# Patient Record
Sex: Female | Born: 1955 | Race: White | Hispanic: No | Marital: Married | Smoking: Former smoker
Health system: Southern US, Community
[De-identification: ages and names within clinical notes are randomized; demographics above are authoritative.]

## PROBLEM LIST (undated history)

## (undated) DIAGNOSIS — K5090 Crohn's disease, unspecified, without complications: Secondary | ICD-10-CM

## (undated) DIAGNOSIS — R945 Abnormal results of liver function studies: Secondary | ICD-10-CM

## (undated) DIAGNOSIS — H269 Unspecified cataract: Secondary | ICD-10-CM

## (undated) DIAGNOSIS — N6310 Unspecified lump in the right breast, unspecified quadrant: Secondary | ICD-10-CM

## (undated) DIAGNOSIS — M1990 Unspecified osteoarthritis, unspecified site: Secondary | ICD-10-CM

## (undated) DIAGNOSIS — Z460 Encounter for fitting and adjustment of spectacles and contact lenses: Secondary | ICD-10-CM

## (undated) DIAGNOSIS — K5790 Diverticulosis of intestine, part unspecified, without perforation or abscess without bleeding: Secondary | ICD-10-CM

## (undated) DIAGNOSIS — D649 Anemia, unspecified: Secondary | ICD-10-CM

## (undated) DIAGNOSIS — R7989 Other specified abnormal findings of blood chemistry: Secondary | ICD-10-CM

## (undated) HISTORY — DX: Unspecified osteoarthritis, unspecified site: M19.90

## (undated) HISTORY — DX: Unspecified cataract: H26.9

## (undated) HISTORY — PX: BREAST SURGERY: SHX581

## (undated) HISTORY — PX: OTHER SURGICAL HISTORY: SHX169

## (undated) HISTORY — DX: Other specified abnormal findings of blood chemistry: R79.89

## (undated) HISTORY — PX: BREAST EXCISIONAL BIOPSY: SUR124

## (undated) HISTORY — DX: Diverticulosis of intestine, part unspecified, without perforation or abscess without bleeding: K57.90

## (undated) HISTORY — DX: Abnormal results of liver function studies: R94.5

## (undated) HISTORY — DX: Crohn's disease, unspecified, without complications: K50.90

## (undated) HISTORY — DX: Unspecified lump in the right breast, unspecified quadrant: N63.10

---

## 1978-12-23 ENCOUNTER — Encounter (INDEPENDENT_AMBULATORY_CARE_PROVIDER_SITE_OTHER): Payer: Self-pay | Admitting: Gastroenterology

## 1994-06-25 ENCOUNTER — Encounter (INDEPENDENT_AMBULATORY_CARE_PROVIDER_SITE_OTHER): Payer: Self-pay | Admitting: *Deleted

## 1996-09-13 ENCOUNTER — Encounter (INDEPENDENT_AMBULATORY_CARE_PROVIDER_SITE_OTHER): Payer: Self-pay | Admitting: *Deleted

## 1996-10-27 ENCOUNTER — Encounter (INDEPENDENT_AMBULATORY_CARE_PROVIDER_SITE_OTHER): Payer: Self-pay | Admitting: *Deleted

## 1996-11-02 ENCOUNTER — Encounter (INDEPENDENT_AMBULATORY_CARE_PROVIDER_SITE_OTHER): Payer: Self-pay | Admitting: *Deleted

## 1996-12-15 ENCOUNTER — Encounter (INDEPENDENT_AMBULATORY_CARE_PROVIDER_SITE_OTHER): Payer: Self-pay | Admitting: *Deleted

## 1998-07-28 ENCOUNTER — Other Ambulatory Visit: Admission: RE | Admit: 1998-07-28 | Discharge: 1998-07-28 | Payer: Self-pay | Admitting: Obstetrics and Gynecology

## 1999-11-08 ENCOUNTER — Other Ambulatory Visit: Admission: RE | Admit: 1999-11-08 | Discharge: 1999-11-08 | Payer: Self-pay | Admitting: Obstetrics and Gynecology

## 2000-05-23 ENCOUNTER — Encounter (INDEPENDENT_AMBULATORY_CARE_PROVIDER_SITE_OTHER): Payer: Self-pay | Admitting: *Deleted

## 2000-07-08 ENCOUNTER — Encounter (INDEPENDENT_AMBULATORY_CARE_PROVIDER_SITE_OTHER): Payer: Self-pay | Admitting: *Deleted

## 2000-07-08 ENCOUNTER — Other Ambulatory Visit: Admission: RE | Admit: 2000-07-08 | Discharge: 2000-07-08 | Payer: Self-pay | Admitting: Gastroenterology

## 2000-07-08 ENCOUNTER — Encounter (INDEPENDENT_AMBULATORY_CARE_PROVIDER_SITE_OTHER): Payer: Self-pay

## 2001-08-07 ENCOUNTER — Other Ambulatory Visit: Admission: RE | Admit: 2001-08-07 | Discharge: 2001-08-07 | Payer: Self-pay | Admitting: Obstetrics and Gynecology

## 2001-11-25 HISTORY — PX: ABDOMINAL HYSTERECTOMY: SHX81

## 2002-01-22 ENCOUNTER — Encounter (INDEPENDENT_AMBULATORY_CARE_PROVIDER_SITE_OTHER): Payer: Self-pay

## 2002-01-22 ENCOUNTER — Inpatient Hospital Stay (HOSPITAL_COMMUNITY): Admission: RE | Admit: 2002-01-22 | Discharge: 2002-01-24 | Payer: Self-pay

## 2002-01-22 ENCOUNTER — Encounter: Payer: Self-pay | Admitting: Obstetrics and Gynecology

## 2002-03-03 ENCOUNTER — Encounter (INDEPENDENT_AMBULATORY_CARE_PROVIDER_SITE_OTHER): Payer: Self-pay | Admitting: *Deleted

## 2002-03-03 ENCOUNTER — Ambulatory Visit (HOSPITAL_COMMUNITY): Admission: RE | Admit: 2002-03-03 | Discharge: 2002-03-03 | Payer: Self-pay | Admitting: Gastroenterology

## 2002-03-03 ENCOUNTER — Encounter: Payer: Self-pay | Admitting: Gastroenterology

## 2002-09-28 ENCOUNTER — Other Ambulatory Visit: Admission: RE | Admit: 2002-09-28 | Discharge: 2002-09-28 | Payer: Self-pay | Admitting: Obstetrics and Gynecology

## 2002-12-06 ENCOUNTER — Encounter (INDEPENDENT_AMBULATORY_CARE_PROVIDER_SITE_OTHER): Payer: Self-pay | Admitting: *Deleted

## 2002-12-06 ENCOUNTER — Encounter: Admission: RE | Admit: 2002-12-06 | Discharge: 2002-12-06 | Payer: Self-pay | Admitting: Urology

## 2002-12-06 ENCOUNTER — Encounter: Payer: Self-pay | Admitting: Urology

## 2003-10-27 ENCOUNTER — Other Ambulatory Visit: Admission: RE | Admit: 2003-10-27 | Discharge: 2003-10-27 | Payer: Self-pay | Admitting: Obstetrics and Gynecology

## 2004-02-23 ENCOUNTER — Encounter (INDEPENDENT_AMBULATORY_CARE_PROVIDER_SITE_OTHER): Payer: Self-pay | Admitting: Gastroenterology

## 2004-09-03 ENCOUNTER — Encounter: Admission: RE | Admit: 2004-09-03 | Discharge: 2004-09-03 | Payer: Self-pay | Admitting: Obstetrics and Gynecology

## 2004-12-10 ENCOUNTER — Other Ambulatory Visit: Admission: RE | Admit: 2004-12-10 | Discharge: 2004-12-10 | Payer: Self-pay | Admitting: Obstetrics and Gynecology

## 2005-09-11 ENCOUNTER — Ambulatory Visit: Payer: Self-pay | Admitting: Gastroenterology

## 2006-03-19 ENCOUNTER — Other Ambulatory Visit: Admission: RE | Admit: 2006-03-19 | Discharge: 2006-03-19 | Payer: Self-pay | Admitting: Obstetrics and Gynecology

## 2006-12-17 ENCOUNTER — Ambulatory Visit: Payer: Self-pay | Admitting: Gastroenterology

## 2006-12-17 LAB — CONVERTED CEMR LAB
ALT: 26 units/L (ref 0–40)
AST: 24 units/L (ref 0–37)
Albumin: 4.2 g/dL (ref 3.5–5.2)
Alkaline Phosphatase: 60 units/L (ref 39–117)
BUN: 11 mg/dL (ref 6–23)
Basophils Absolute: 0 10*3/uL (ref 0.0–0.1)
Basophils Relative: 0.4 % (ref 0.0–1.0)
CO2: 30 meq/L (ref 19–32)
Calcium: 9.8 mg/dL (ref 8.4–10.5)
Chloride: 103 meq/L (ref 96–112)
Creatinine, Ser: 0.9 mg/dL (ref 0.4–1.2)
Eosinophils Relative: 2.7 % (ref 0.0–5.0)
GFR calc Af Amer: 86 mL/min
GFR calc non Af Amer: 71 mL/min
Glucose, Bld: 101 mg/dL — ABNORMAL HIGH (ref 70–99)
HCT: 38.8 % (ref 36.0–46.0)
Hemoglobin: 13.6 g/dL (ref 12.0–15.0)
Lymphocytes Relative: 24.7 % (ref 12.0–46.0)
MCHC: 35.1 g/dL (ref 30.0–36.0)
MCV: 85 fL (ref 78.0–100.0)
Monocytes Absolute: 0.4 10*3/uL (ref 0.2–0.7)
Monocytes Relative: 7 % (ref 3.0–11.0)
Neutro Abs: 3.8 10*3/uL (ref 1.4–7.7)
Neutrophils Relative %: 65.2 % (ref 43.0–77.0)
Platelets: 348 10*3/uL (ref 150–400)
Potassium: 4.4 meq/L (ref 3.5–5.1)
RBC: 4.57 M/uL (ref 3.87–5.11)
RDW: 12.4 % (ref 11.5–14.6)
Sed Rate: 30 mm/hr — ABNORMAL HIGH (ref 0–25)
Sodium: 139 meq/L (ref 135–145)
Total Bilirubin: 1 mg/dL (ref 0.3–1.2)
Total Protein: 7.7 g/dL (ref 6.0–8.3)
WBC: 5.9 10*3/uL (ref 4.5–10.5)

## 2006-12-23 ENCOUNTER — Ambulatory Visit (HOSPITAL_COMMUNITY): Admission: RE | Admit: 2006-12-23 | Discharge: 2006-12-23 | Payer: Self-pay | Admitting: Gastroenterology

## 2006-12-23 ENCOUNTER — Encounter (INDEPENDENT_AMBULATORY_CARE_PROVIDER_SITE_OTHER): Payer: Self-pay | Admitting: *Deleted

## 2006-12-29 ENCOUNTER — Ambulatory Visit: Payer: Self-pay | Admitting: Gastroenterology

## 2007-04-28 ENCOUNTER — Encounter: Admission: RE | Admit: 2007-04-28 | Discharge: 2007-04-28 | Payer: Self-pay | Admitting: Obstetrics and Gynecology

## 2008-02-10 ENCOUNTER — Ambulatory Visit: Payer: Self-pay | Admitting: Internal Medicine

## 2008-02-10 LAB — CONVERTED CEMR LAB
ALT: 27 units/L (ref 0–35)
AST: 25 units/L (ref 0–37)
Albumin: 4.1 g/dL (ref 3.5–5.2)
BUN: 10 mg/dL (ref 6–23)
Basophils Absolute: 0 10*3/uL (ref 0.0–0.1)
Bilirubin, Direct: 0.1 mg/dL (ref 0.0–0.3)
GFR calc Af Amer: 114 mL/min
Hemoglobin: 13.1 g/dL (ref 12.0–15.0)
Lymphocytes Relative: 24.5 % (ref 12.0–46.0)
MCHC: 33 g/dL (ref 30.0–36.0)
MCV: 87.7 fL (ref 78.0–100.0)
Monocytes Absolute: 0.3 10*3/uL (ref 0.2–0.7)
Monocytes Relative: 5.2 % (ref 3.0–11.0)
Neutro Abs: 3.6 10*3/uL (ref 1.4–7.7)
Potassium: 4.5 meq/L (ref 3.5–5.1)
Total Bilirubin: 1 mg/dL (ref 0.3–1.2)

## 2008-03-11 ENCOUNTER — Ambulatory Visit: Payer: Self-pay | Admitting: Internal Medicine

## 2008-03-11 LAB — CONVERTED CEMR LAB
Basophils Absolute: 0 10*3/uL (ref 0.0–0.1)
Basophils Relative: 0.2 % (ref 0.0–1.0)
Eosinophils Absolute: 0.1 10*3/uL (ref 0.0–0.7)
MCHC: 33.7 g/dL (ref 30.0–36.0)
MCV: 87.2 fL (ref 78.0–100.0)
Neutro Abs: 3.6 10*3/uL (ref 1.4–7.7)
Neutrophils Relative %: 61.6 % (ref 43.0–77.0)
RBC: 4.55 M/uL (ref 3.87–5.11)
RDW: 12.8 % (ref 11.5–14.6)

## 2008-03-14 ENCOUNTER — Ambulatory Visit: Payer: Self-pay | Admitting: Internal Medicine

## 2008-05-17 ENCOUNTER — Telehealth (INDEPENDENT_AMBULATORY_CARE_PROVIDER_SITE_OTHER): Payer: Self-pay | Admitting: *Deleted

## 2008-12-01 ENCOUNTER — Telehealth: Payer: Self-pay | Admitting: Internal Medicine

## 2009-02-22 ENCOUNTER — Ambulatory Visit: Payer: Self-pay | Admitting: Internal Medicine

## 2009-03-08 ENCOUNTER — Ambulatory Visit: Payer: Self-pay | Admitting: Internal Medicine

## 2009-03-08 ENCOUNTER — Encounter: Payer: Self-pay | Admitting: Internal Medicine

## 2009-03-08 DIAGNOSIS — K5090 Crohn's disease, unspecified, without complications: Secondary | ICD-10-CM | POA: Insufficient documentation

## 2009-03-09 LAB — CONVERTED CEMR LAB
ALT: 108 units/L — ABNORMAL HIGH (ref 0–35)
Albumin: 4.2 g/dL (ref 3.5–5.2)
Basophils Absolute: 0 10*3/uL (ref 0.0–0.1)
CO2: 27 meq/L (ref 19–32)
Calcium: 9.2 mg/dL (ref 8.4–10.5)
Chloride: 110 meq/L (ref 96–112)
GFR calc non Af Amer: 80.32 mL/min (ref >60)
Lymphocytes Relative: 25.9 % (ref 12.0–46.0)
Monocytes Relative: 4.4 % (ref 3.0–12.0)
Platelets: 300 10*3/uL (ref 150.0–400.0)
Potassium: 4.2 meq/L (ref 3.5–5.1)
RDW: 13.3 % (ref 11.5–14.6)
Sodium: 144 meq/L (ref 135–145)
Total Protein: 7.5 g/dL (ref 6.0–8.3)
WBC: 5.3 10*3/uL (ref 4.5–10.5)

## 2009-03-10 ENCOUNTER — Encounter: Payer: Self-pay | Admitting: Internal Medicine

## 2009-03-10 ENCOUNTER — Telehealth (INDEPENDENT_AMBULATORY_CARE_PROVIDER_SITE_OTHER): Payer: Self-pay | Admitting: *Deleted

## 2009-03-13 ENCOUNTER — Telehealth (INDEPENDENT_AMBULATORY_CARE_PROVIDER_SITE_OTHER): Payer: Self-pay | Admitting: *Deleted

## 2009-03-20 ENCOUNTER — Ambulatory Visit: Payer: Self-pay | Admitting: Internal Medicine

## 2009-03-20 LAB — CONVERTED CEMR LAB: HCV Ab: NEGATIVE

## 2009-03-22 ENCOUNTER — Encounter: Payer: Self-pay | Admitting: Internal Medicine

## 2009-03-27 ENCOUNTER — Encounter: Payer: Self-pay | Admitting: Internal Medicine

## 2009-04-03 ENCOUNTER — Telehealth (INDEPENDENT_AMBULATORY_CARE_PROVIDER_SITE_OTHER): Payer: Self-pay | Admitting: *Deleted

## 2009-04-10 ENCOUNTER — Ambulatory Visit: Payer: Self-pay | Admitting: Internal Medicine

## 2009-04-11 LAB — CONVERTED CEMR LAB
ALT: 80 units/L — ABNORMAL HIGH (ref 0–35)
Alkaline Phosphatase: 71 units/L (ref 39–117)
Bilirubin, Direct: 0.1 mg/dL (ref 0.0–0.3)
Total Protein: 7.7 g/dL (ref 6.0–8.3)

## 2009-06-02 ENCOUNTER — Ambulatory Visit: Payer: Self-pay | Admitting: Internal Medicine

## 2009-06-05 LAB — CONVERTED CEMR LAB
Basophils Absolute: 0 10*3/uL (ref 0.0–0.1)
Eosinophils Absolute: 0.2 10*3/uL (ref 0.0–0.7)
Hemoglobin: 14 g/dL (ref 12.0–15.0)
Lymphocytes Relative: 22.3 % (ref 12.0–46.0)
MCHC: 34.7 g/dL (ref 30.0–36.0)
Neutro Abs: 4.6 10*3/uL (ref 1.4–7.7)
RDW: 12.4 % (ref 11.5–14.6)

## 2009-06-06 ENCOUNTER — Ambulatory Visit: Payer: Self-pay | Admitting: Internal Medicine

## 2009-06-06 DIAGNOSIS — K5010 Crohn's disease of large intestine without complications: Secondary | ICD-10-CM | POA: Insufficient documentation

## 2009-06-07 DIAGNOSIS — R740 Nonspecific elevation of levels of transaminase and lactic acid dehydrogenase [LDH]: Secondary | ICD-10-CM

## 2009-06-07 DIAGNOSIS — R7401 Elevation of levels of liver transaminase levels: Secondary | ICD-10-CM | POA: Insufficient documentation

## 2009-06-16 LAB — CONVERTED CEMR LAB
Albumin: 3.8 g/dL (ref 3.5–5.2)
Alkaline Phosphatase: 83 units/L (ref 39–117)

## 2009-08-14 ENCOUNTER — Telehealth (INDEPENDENT_AMBULATORY_CARE_PROVIDER_SITE_OTHER): Payer: Self-pay | Admitting: *Deleted

## 2010-03-20 ENCOUNTER — Telehealth (INDEPENDENT_AMBULATORY_CARE_PROVIDER_SITE_OTHER): Payer: Self-pay | Admitting: *Deleted

## 2010-06-18 ENCOUNTER — Telehealth: Payer: Self-pay | Admitting: Internal Medicine

## 2010-12-16 ENCOUNTER — Encounter: Payer: Self-pay | Admitting: Obstetrics and Gynecology

## 2010-12-16 ENCOUNTER — Telehealth: Payer: Self-pay | Admitting: Internal Medicine

## 2010-12-27 NOTE — Progress Notes (Signed)
Summary: Records request from [place] for records received from [facility]. Request forwarded to [facility]. Dena Chavis  March 20, 2010 1:17 PM

## 2010-12-27 NOTE — Progress Notes (Signed)
Summary: ON CALL: ? Crohns flare   Phone Note Call from Patient   Caller: Patient Call For: Dr Henrene Pastor Details for Reason: Crohn"s flare Summary of Call: Has not been seen in 18 months. Does have a history of some megdical noncompliance.Calls while on the road c/o increased BMs and some pain. No fvrs or bleeding. Asked to come to office in am to be see. Said she can't, as she is working on the road as a Administrator. She Asked if she could take some old 6MP tabs. Told not too - previous  concerns of hepatotoxicity. I told her that I do not feel comfortable treating her w/o proper assessment. advised to go to a local ER if she is sick. She will keep her Feb. appt. Initial call taken by: Irene Shipper MD,  December 16, 2010 12:09 PM

## 2010-12-27 NOTE — Progress Notes (Signed)
Summary: note   Phone Note Call from Patient Call back at Home Phone [phone]   Caller: Patient Call For: Dr. Henrene Pastor Reason for Call: Talk to Nurse Summary of Call: needs official note from Dr. Henrene Pastor for her insurance stating that she has been diagnosed with Crohn's Initial call taken by: Lucien Mons,  June 18, 2010 9:14 AM  Follow-up for Phone Call        Pt. given # for [place] she qualifies for [facility] and will come and sign record release to send records to them. Follow-up by: Abel Presto RN,  June 19, 2010 9:16 AM

## 2011-01-03 ENCOUNTER — Ambulatory Visit (INDEPENDENT_AMBULATORY_CARE_PROVIDER_SITE_OTHER): Payer: PRIVATE HEALTH INSURANCE | Admitting: Internal Medicine

## 2011-01-03 ENCOUNTER — Encounter (INDEPENDENT_AMBULATORY_CARE_PROVIDER_SITE_OTHER): Payer: Self-pay | Admitting: *Deleted

## 2011-01-03 ENCOUNTER — Other Ambulatory Visit: Payer: PRIVATE HEALTH INSURANCE

## 2011-01-03 ENCOUNTER — Encounter: Payer: Self-pay | Admitting: Internal Medicine

## 2011-01-03 ENCOUNTER — Other Ambulatory Visit: Payer: Self-pay | Admitting: Internal Medicine

## 2011-01-03 DIAGNOSIS — R197 Diarrhea, unspecified: Secondary | ICD-10-CM

## 2011-01-03 DIAGNOSIS — K5010 Crohn's disease of large intestine without complications: Secondary | ICD-10-CM

## 2011-01-03 LAB — BASIC METABOLIC PANEL
CO2: 26 mEq/L (ref 19–32)
Calcium: 8.8 mg/dL (ref 8.4–10.5)
GFR: 90.06 mL/min (ref >60.00)
Sodium: 137 mEq/L (ref 135–145)

## 2011-01-03 LAB — CBC WITH DIFFERENTIAL/PLATELET
Basophils Absolute: 0 10*3/uL (ref 0.0–0.1)
HCT: 35 % — ABNORMAL LOW (ref 36.0–46.0)
Lymphs Abs: 1.5 10*3/uL (ref 0.7–4.0)
Monocytes Absolute: 0.5 10*3/uL (ref 0.1–1.0)
Monocytes Relative: 5.1 % (ref 3.0–12.0)
Platelets: 391 10*3/uL (ref 150.0–400.0)
RDW: 12.9 % (ref 11.5–14.6)

## 2011-01-03 LAB — HEPATIC FUNCTION PANEL
Alkaline Phosphatase: 56 U/L (ref 39–117)
Bilirubin, Direct: 0.1 mg/dL (ref 0.0–0.3)

## 2011-01-04 ENCOUNTER — Encounter: Payer: Self-pay | Admitting: Internal Medicine

## 2011-01-05 ENCOUNTER — Encounter: Payer: Self-pay | Admitting: Internal Medicine

## 2011-01-10 NOTE — Progress Notes (Signed)
Summary: [name]  [name]   Imported By: Phillis Knack 01/04/2011 09:22:58  _____________________________________________________________________  External Attachment:    Type:   Image     Comment:   External Document

## 2011-01-10 NOTE — Progress Notes (Signed)
Summary: [place]  [place]   Imported By: Phillis Knack 01/04/2011 09:11:43  _____________________________________________________________________  External Attachment:    Type:   Image     Comment:   External Document

## 2011-01-10 NOTE — Procedures (Signed)
Summary: Colonoscopy/[place]  Colonoscopy/[place]   Imported By: Phillis Knack 01/04/2011 09:07:01  _____________________________________________________________________  External Attachment:    Type:   Image     Comment:   External Document

## 2011-01-10 NOTE — Assessment & Plan Note (Signed)
Summary: Crohns flare     History of Present Illness Visit Type: Follow-up Visit Primary GI MD: Scarlette Shorts MD Primary Provider: Jacqulyn Cane, MD Requesting Provider: na Chief Complaint: Pt c/o crohn's flare with diarrhea and LLQ abd pain with burning. Pt denies any other GI complaints History of Present Illness:   55 year old female with a greater than 20 year history of Crohn's colitis. She was last evaluated in July of 2010. She has been lost to followup until she recently contacted the office with symptoms suggesting a flare of disease. In brief summary, she is intolerant to prednisone. She had previously been on 6 MP monotherapy with active colonic inflammation on colonoscopy, some GI symptoms, and suboptimal GTTN levels. A subsequent modest increase in 6-MP resulted in resolution of colonic inflammation and GI symptoms as evidenced on her most recent colonoscopy April 2010. However, on routine blood testing, she was found to have new liver test abnormalities. The viral hepatitis excluded. 6-MP metabolite suggesting possible hepatotoxicity. The drug stopped and liver test abnormalities resolved. She tells me that she has done clinically well until mid December when she began to develop abdominal cramping with urgency and diarrhea as well as left lower quadrant pain. Some nausea though no vomiting. Generalized fatigue with 6-8 pound weight loss. Using Imodium p.r.n. with modest benefit. No bleeding or mucus. No antibiotic exposure. 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   GI Review of Systems    Reports abdominal pain, nausea, and  weight loss.     Location of  Abdominal pain: left side.    Denies acid reflux, belching, bloating, chest pain, dysphagia with liquids, dysphagia with solids, heartburn, loss of appetite, vomiting, vomiting blood, and  weight gain.      Reports diarrhea.     Denies anal fissure, black tarry stools, change  in bowel habit, constipation, diverticulosis, fecal incontinence, heme positive stool, hemorrhoids, irritable bowel syndrome, jaundice, light color stool, liver problems, rectal bleeding, and  rectal pain.    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 Hysterectomy Adenoidectomy  Family History: Reviewed history from 06/06/2009 and no changes required. Family History of Breast Cancer: Pat Aunt No FH of Colon Cancer: Family History of Diabetes: Brother, MGF Family History of Heart Disease: PGF, MGF  Social History: Reviewed history from 06/06/2009 and no changes required. Married, no kids Patient is a former smoker.  Quit 20 years ago Alcohol Use - yes weekend social Illicit Drug Use - no Patient does not get regular exercise.   Review of Systems       The patient complains of fatigue.  The patient denies allergy/sinus, anemia, anxiety-new, arthritis/joint pain, back pain, blood in urine, breast changes/lumps, change in vision, confusion, cough, coughing up blood, depression-new, fainting, fever, headaches-new, hearing problems, heart murmur, heart rhythm changes, itching, menstrual pain, muscle pains/cramps, night sweats, nosebleeds, pregnancy symptoms, shortness of breath, skin rash, sleeping problems, sore throat, swelling of feet/legs, swollen lymph glands, thirst - excessive , urination - excessive , urination changes/pain, urine leakage, vision changes, and voice change.    Vital Signs:  Patient profile:   55 year old female Height:      64 inches Weight:      200.2 pounds BMI:  34.49 BSA:     1.96 Pulse rate:   68 / minute Pulse rhythm:   regular BP sitting:   132 / 76  (left arm) Cuff size:   regular  Vitals  Entered By: Hope Pigeon CMA (January 03, 2011 8:46 AM)  Physical Exam  General:  Well developed, well nourished, no acute distress. Head:  Normocephalic and atraumatic. Eyes:  PERRLA, no icterus. Ears:  Normal auditory acuity. Nose:  No deformity, discharge,  or lesions. Mouth:  No deformity or lesions, dentition normal. Neck:  Supple; no masses or thyromegaly. Lungs:  Clear throughout to auscultation. Heart:  Regular rate and rhythm; no murmurs, rubs,  or bruits. Abdomen:  Soft, nontender and nondistended. No masses, hepatosplenomegaly or hernias noted. Normal bowel sounds. Msk:  Symmetrical with no gross deformities. Normal posture. Pulses:  Normal pulses noted. Extremities:  No clubbing, cyanosis, edema or deformities noted. Neurologic:  Alert and  oriented x4;  grossly normal neurologically. Skin:  Intact without significant lesions or rashes. Psych:  Alert and cooperative. Normal mood and affect.   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 Now experiencing what sounds like a typical flare. She is not toxic and has a benign exam. Intolerant to prednisone and 6-MP as outlined previously. May be a candidate for biological therapy. Medical noncompliance has been an issue previously. We discussed her case together and addressed importance of these issues.  Plan #1. Laboratories including [employer] and C. reactive protein #2. Stool studies including ova and parasites and C. difficile by PCR #3. Empiric course of metronidazole 500 mg b.i.d. x2 weeks #4. Schedule colonoscopy to define the extent and severity of disease. As well, provide surveillance biopsies. The nature of the procedure as well as the risks, benefits, and alternatives have been reviewed. She understood and agreed to proceed. She will set this up at her nearest convenience, based on her schedule.  Other  Orders: T-Stool for O&P (16010-93235) T-Stool Giardia / Crypto- EIA (57322) T-C diff by PCR (02542) Colonoscopy (Colon) TLB-CBC Platelet - w/Differential (85025-CBCD) TLB-BMP (Basic Metabolic Panel-BMET) (70623-JSEGBTD) TLB-Hepatic/Liver Function Pnl (80076-HEPATIC) TLB-TSH (Thyroid Stimulating Hormone) (84443-TSH) TLB-CRP-High Sensitivity (C-Reactive Protein) (86140-FCRP)  Patient Instructions: 1)  Labs ordered for you to go to basement floor and have done today. 2)  Metronidazole 500 mg 1 by mouth two times a day #28 no refills. 3)  Colonoscopy [facility] 01/30/11 10:00 am arrive at 9:00 am on 4th floor.  4)  Copy sent to : Jacqulyn Cane, MD 5)  The medication list was reviewed and reconciled.  All changed / newly prescribed medications were explained.  A complete medication list was provided to the patient / caregiver. Prescriptions: MOVIPREP 100 GM  SOLR (PEG-KCL-NACL-NASULF-NA ASC-C) As per prep instructions.  #1 x 0   Entered by:   Randye Lobo NCMA   Authorized by:   Irene Shipper MD   Signed by:   Randye Lobo NCMA on 01/03/2011   Method used:   Electronically to        [facility] [phone]* (retail)       [address]       [place], [place]  [address]       Ph: [phone]       Fax: [phone]   RxID:   [id number] METRONIDAZOLE 500 MG TABS (METRONIDAZOLE) 1 by mouth two times a day  #28 x 0   Entered by:   Pamala Hurry  Raymondo Band   Authorized by:   Irene Shipper MD   Signed by:   Randye Lobo NCMA on 01/03/2011   Method used:   Electronically to        [facility] [phone]* (retail)       [address]       [place], [place]  [address]       Ph: [phone]       Fax: [phone]   RxID:   [id number]

## 2011-01-10 NOTE — Procedures (Signed)
Summary: Upper GI Endoscopy/[place]  Upper GI Endoscopy/[place]   Imported By: Phillis Knack 01/04/2011 09:07:59  _____________________________________________________________________  External Attachment:    Type:   Image     Comment:   External Document

## 2011-01-10 NOTE — Progress Notes (Signed)
Summary: [facility]  [place]   Imported By: Phillis Knack 01/04/2011 08:57:11  _____________________________________________________________________  External Attachment:    Type:   Image     Comment:   External Document

## 2011-01-10 NOTE — Progress Notes (Signed)
Summary: [place]  [place]   Imported By: Phillis Knack 01/04/2011 09:20:58  _____________________________________________________________________  External Attachment:    Type:   Image     Comment:   External Document

## 2011-01-10 NOTE — Procedures (Signed)
Summary: [facility]   Imported By: Phillis Knack 01/04/2011 09:10:16  _____________________________________________________________________  External Attachment:    Type:   Image     Comment:   External Document

## 2011-01-10 NOTE — Letter (Signed)
Summary: [facility] Medical Instructions  [facility]  [place], [place] [address]   Phone: [phone]  Fax: [phone]       SHATIKA GRINNELL    30-Dec-1955    MRN: [id number]        Procedure Day Sudie Grumbling: Marlana Latus 01/30/11     Arrival Time:9:00 am     Procedure Time:10:00 am     Location of Procedure:                    x  [place] (4th Floor)         [place]   Starting 5 days prior to your procedure 01/25/11 do not eat nuts, seeds, popcorn, corn, beans, peas,  salads, or any raw vegetables.  Do not take any fiber supplements (e.g. Metamucil, Citrucel, and Benefiber).  THE DAY BEFORE YOUR PROCEDURE         DATE:01/29/11  DAY: tuesday  1.  Drink clear liquids the entire day-NO SOLID FOOD  2.  Do not drink anything colored red or purple.  Avoid juices with pulp.  No orange juice.  3.  Drink at least 64 oz. (8 glasses) of fluid/clear liquids during the day to prevent dehydration and help the prep work efficiently.  CLEAR LIQUIDS INCLUDE: Water Jello Ice Popsicles Tea (sugar ok, no milk/cream) Powdered fruit flavored drinks Coffee (sugar ok, no milk/cream) Gatorade Juice: apple, white grape, white cranberry  Lemonade Clear bullion, consomm, broth Carbonated beverages (any kind) Strained chicken noodle soup Hard Candy                             4.  In the morning, mix first dose of MoviPrep solution:    Empty 1 Pouch A and 1 Pouch B into the disposable container    Add lukewarm drinking water to the top line of the container. Mix to dissolve    Refrigerate (mixed solution should be used within 24 hrs)  5.  Begin drinking the prep at 5:00 p.m. The MoviPrep container is divided by 4 marks.   Every 15 minutes drink the solution down to the next mark (approximately 8 oz) until the full liter is complete.   6.  Follow completed prep with 16 oz of clear liquid of your choice (Nothing red or purple).  Continue  to drink clear liquids until bedtime.  7.  Before going to bed, mix second dose of MoviPrep solution:    Empty 1 Pouch A and 1 Pouch B into the disposable container    Add lukewarm drinking water to the top line of the container. Mix to dissolve    Refrigerate  THE DAY OF YOUR PROCEDURE      DATE: 01/30/11 DAY: Marlana Latus  Beginning at 5:00 a.m. (5 hours before procedure):         1. Every 15 minutes, drink the solution down to the next mark (approx 8 oz) until the full liter is complete.  2. Follow completed prep with 16 oz. of clear liquid of your choice.    3. You may drink clear liquids until 8:00 am (2 HOURS BEFORE PROCEDURE).   MEDICATION INSTRUCTIONS  Unless otherwise instructed, you should take regular prescription medications with a small sip of water   as early as possible the morning of your procedure.         OTHER INSTRUCTIONS  You will need a responsible adult  at least 53 years of age to accompany you and drive you home.   This person must remain in the waiting room during your procedure.  Wear loose fitting clothing that is easily removed.  Leave jewelry and other valuables at home.  However, you may wish to bring a book to read or  an iPod/MP3 player to listen to music as you wait for your procedure to start.  Remove all body piercing jewelry and leave at home.  Total time from sign-in until discharge is approximately 2-3 hours.  You should go home directly after your procedure and rest.  You can resume normal activities the  day after your procedure.  The day of your procedure you should not:   Drive   Make legal decisions   Operate machinery   Drink alcohol   Return to work  You will receive specific instructions about eating, activities and medications before you leave.    The above instructions have been reviewed and explained to me by   _______________________    I fully understand and can verbalize these instructions  _____________________________ Date _________

## 2011-01-30 ENCOUNTER — Other Ambulatory Visit: Payer: Self-pay | Admitting: Internal Medicine

## 2011-01-30 ENCOUNTER — Encounter: Payer: Self-pay | Admitting: Internal Medicine

## 2011-01-30 ENCOUNTER — Other Ambulatory Visit (AMBULATORY_SURGERY_CENTER): Payer: PRIVATE HEALTH INSURANCE | Admitting: Internal Medicine

## 2011-01-30 DIAGNOSIS — Z1211 Encounter for screening for malignant neoplasm of colon: Secondary | ICD-10-CM

## 2011-01-30 DIAGNOSIS — K5010 Crohn's disease of large intestine without complications: Secondary | ICD-10-CM

## 2011-01-30 DIAGNOSIS — K5289 Other specified noninfective gastroenteritis and colitis: Secondary | ICD-10-CM

## 2011-02-05 NOTE — Procedures (Addendum)
Summary: Colonoscopy  Patient: [name] Note: All result statuses are Final unless otherwise noted.  Tests: (1) Colonoscopy (COL)   COL Colonoscopy           [facility]     [place], [place]  [address]          COLONOSCOPY PROCEDURE REPORT          PATIENT:  [name] [name], [name] [name]  MR#:  #[id number]     BIRTHDATE:  1956/03/27, 79 yrs. old  GENDER:  female     ENDOSCOPIST:  Docia Chuck. Geri Seminole, MD     REF. BY:  Office Self     PROCEDURE DATE:  01/30/2011     PROCEDURE:  Colonoscopy with biopsies     ASA CLASS:  Class II     INDICATIONS:  Crohn's disease, surveillance and high-risk     screening ; some recent symptoms; 39yrhx Crohns colitis; last     exam 02-2009     MEDICATIONS:   Fentanyl 75 mcg IV, Versed 8 mg IV          DESCRIPTION OF PROCEDURE:   After the risks benefits and     alternatives of the procedure were thoroughly explained, informed     consent was obtained.  Digital rectal exam was performed and     revealed no abnormalities.   The LB CF-[id number]     was introduced through the anus and advanced to the cecum, which     was identified by both the appendix and ileocecal valve, without     limitations.Time to cecum = 2:51 min.  The quality of the prep was     excellent, using MoviPrep.  The instrument was then slowly     withdrawn (time = 6:20 min) as the colon was fully examined.     <<PROCEDUREIMAGES>>          FINDINGS:  The terminal ileum wsa deeply intubated and appeared     normal.  There were mucosal changes consistent with Crohn's     disease. throughout the colon. These were manifested by both     scatterred aphthous ulcers and larger deeper linerar and     serpigenous ulcers. Bx taken.  Retroflexed views in the rectum     revealed no abnormalities.    The scope was then withdrawn from     the patient and the procedure completed.          COMPLICATIONS:  None     ENDOSCOPIC IMPRESSION:     1) Normal  terminal ileum     2) Colitis - Crohn's throughout the colon     RECOMMENDATIONS:     1) Await pathology results     2) Repeat Colonoscopy in 3 years (routine surviellance)     3) Call office next 1-3 days to schedule followup visit in a few     weeks to discuss medical therapies     4) Give patient Educational literature on Biologic agents for     Crohn's          ______________________________     JDocia Chuck PGeri Seminole MD          CC:  DJacqulyn Cane MD; The Patient          n.     eSIGNED:   JDocia Chuck PGeri Seminoleat 01/30/2011 11:16 AM  [name] [name], [name] [name], #[id number]  Note: An exclamation mark (!) indicates a result that was not dispersed into the flowsheet. Document Creation Date: 01/30/2011 11:16 AM _______________________________________________________________________  (1) Order result status: Final Collection or observation date-time: 01/30/2011 11:05 Requested date-time:  Receipt date-time:  Reported date-time:  Referring Physician:   Ordering Physician: Lavena Bullion [phone]) Specimen Source:  Source: Tawanna Cooler Order Number: [id number] Lab site:   Appended Document: Colonoscopy Patient given educational material for Remicade and Humira  Appended Document: Colonoscopy     Procedures Next Due Date:    Colonoscopy: 01/2014

## 2011-02-05 NOTE — Letter (Signed)
Summary: Appt Reminder [facility]  [place], [place] [address]   Phone: [phone]  Fax: [phone]        January 30, 2011 MRN: [id number]    [place], [place]  [address]    Dear Ms. [name] [name],   You have a return appointment with Dr. Henrene Pastor on 02/27/11 at 10am.  Please remember to bring a complete list of the medicines you are taking, your insurance card and your co-pay.  If you have to cancel or reschedule this appointment, please call before 5:00 pm the evening before to avoid a cancellation fee.  If you have any questions or concerns, please call [phone].    Sincerely,    Rosanne Sack RN  Appended Document: Appt Reminder 2 Letter is mailed to the patient's home address

## 2011-02-09 ENCOUNTER — Encounter: Payer: Self-pay | Admitting: Internal Medicine

## 2011-02-12 NOTE — Letter (Addendum)
Summary: Patient Notice- Colon Biospy Results  [facility]  [address] [place], [place] [address]   Phone: [phone]  Fax: [phone]        February 09, 2011 MRN: [id number]    [name] [name] [place], [place]  [address]    Dear [name] [name],  I am pleased to inform you that the biopsies taken during your recent colonoscopy did not show any evidence of cancer or precancerous change upon pathologic examination. As expected, there was active inflammation consistent with Crohn's.  Additional information/recommendations:   __Please keep your return visit to review your condition. Review the provided literature on biologic therapies for Chron's.   __You should have a repeat colonoscopy examination for this problem           in 3 years.    Please call us if you are having persistent problems or have questions about your condition that have not been fully answered at this time.  Sincerely,  Irene Shipper MD   This letter has been electronically signed by your physician.  Appended Document: Patient Notice- Colon Biospy Results letter mailed

## 2011-02-27 ENCOUNTER — Ambulatory Visit: Payer: PRIVATE HEALTH INSURANCE | Admitting: Internal Medicine

## 2011-04-09 NOTE — Assessment & Plan Note (Signed)
[facility]                         GASTROENTEROLOGY OFFICE NOTE   NAME:[name] [name] BALIK                        MRN:          [id number]  DATE:02/10/2008                            DOB:          [birth year]    HISTORY:  This is a 55 year old white female with a history of long-  standing Crohn's colitis, who presents today to re-establish as a new  patient.  The patient has approximately 15-20-year history of Crohn's  disease, involving the colon.  She was initially assessed by Dr. Jim [name] and subsequently has been cared for by Dr. Lyla [name].  The patient  is establishing today, upon Dr. Leanora [name] recent retirement.  She  reports to me initially presenting with joint aches and erythema  nodosum.  As best she can recall and according to the record, her  disease has been limited to the colon.  She has been on a stable dose of  6-mercaptopurine  for years.  Current dosage is 75 mg daily.  She is on  no other medications for her Crohn's disease.  She has not had any blood  work in over a year.  Her last colonoscopy, performed by Dr. Velora [name],  occurred in January of 2008.  At that time, she was found to have active  Crohn's disease, as manifested by linear ulcers and stenosis.  This  involved the transverse and ascending colon.  The ileum was normal.  No  adjustments in medication made.  Patient reports to me that she is  feeling well.  She does have occasional diarrhea or upset stomach, which  she attributes to stress.  No bleeding.  She has had no weight-loss.  She has had anemia in the past.  Her last blood work, in January of  2008, revealed a hemoglobin of 13.6.  Her MCV was normal at 85.  Sedimentation rate elevated at 30.  Basic chemistries essentially  normal.  Albumin and liver tests also normal.   PAST MEDICAL HISTORY:  Crohn's colitis.   PAST SURGICAL HISTORY:  1. Hysterectomy with unilateral oophorectomy.  2. Fistula in ano, status post  fistulotomy, March of 1998.   ALLERGIES:  No known drug allergies.   CURRENT MEDICATIONS:  6-mercaptopurine  75 mg daily, multivitamin and  calcium.   FAMILY HISTORY:  Negative for inflammatory bowel disease or colon  cancer.   SOCIAL HISTORY:  Patient is employed as a Retail buyer.  She is engaged to be married.  No children.  She  does not smoke.  Occasionally uses alcohol.   PHYSICAL EXAM:  Well-appearing female, in no acute distress.  Blood pressure is 110/64, heart rate 64, weight is 214.8 pounds  (increased 7 pounds).  HEENT:  Sclerae anicteric, conjunctivae are pink, oral mucosa is intact,  no adenopathy.  LUNGS:  Clear.  ABDOMEN:  Soft without tenderness, masses or hernia.  Good bowel sounds  heard.  EXTREMITIES:  Without edema.   IMPRESSION:  This is a 55 year old female with a history of long-  standing Crohn's colitis.  Currently on 75 mg of 6-mercaptopurine  today.  By her report, minimal GI symptoms.  I am concerned that she had  active disease on her last colonoscopy.  She was maintained on 75 mg of  6-mercaptopurine.  Assuming that her TPMT phenotype is normal, then this  dose would seem suboptimal.  Optimal dose is about 1.5 mg/kg in normal  metabolizers, which for this patient would be approximately 140 mg of 6-  mercaptopurine  per day.  I discussed with her the long-term  implications of persistent ulceration and inflammation, such as anemia,  stricturing, fistulization, and increased risk of colon cancer.  Given  this, we may need to increase her 6-mercaptopurine  dosage.  I also  discussed with her the importance of more regular blood work while on 6-  mercaptopurine.  With adjustments, blood work every few weeks.  On a  stable dose, at least every three to four months.  She understood all  the above issues as outlined.   RECOMMENDATIONS:  1. Continue current medications for the time-being.  2. Obtain TPMT phenotype, as  well as thiopurine metabolites.  3. Supplemental blood work, including CBC, comprehensive metabolic      panel, erythrocyte sedimentation rate and C-reactive protein.  4. Followup office visit, three to four weeks, to review blood work      and make a decision regarding possible adjustment of her 6-      mercaptopurine.     [name] Chuck. [name] Pastor, MD  Electronically Signed    JNP/MedQ  DD: 02/10/2008  DT: 02/10/2008  Job #: [id number]   cc:   [name] [name], M.D.  [name] [name], M.D.

## 2011-04-09 NOTE — Assessment & Plan Note (Signed)
[facility]                         GASTROENTEROLOGY OFFICE NOTE   NAME:[name] [name], [name] [name]                        MRN:          [id number]  DATE:03/14/2008                            DOB:          Nov 06, 1956    HISTORY:  [name] [name] presents today for followup.  She is a 55 year old with  longstanding Crohn's colitis, who was evaluated February 10, 2008 to  establish as a new patient with me.  See that dictation for details.  At  that time, the principal concern was active disease on her previous  colonoscopy.  Minimal clinical complaints.  It was also noted that her 6-  mercaptopurine dosage may have been suboptimal based on body weight.  As  such, laboratories were obtained including CBC, comprehensive metabolic  panel, sedimentation rate, and C-reactive protein.  CBC, sedimentation  rate, C-reactive protein, and liver function tests were all normal.  Her  basic metabolic panel was normal except for a minimally elevated glucose  at 107.  She has had no interval complaints or problems.  We also  obtained her TPMT enzyme activity which was in the normal range.  Finally, thiopurine metabolites were obtained.  Her 6-TGN metabolite  levels were low at 169.  Her 6-MMPN levels were somewhat elevated at  6738.  She presents now for followup.   PHYSICAL EXAMINATION:  Limited physical exam.  The patient is alert and  oriented, in no acute distress.  Blood pressure is 130/78, heart rate is  76, weight is 216.4 pounds.  ABDOMEN:  Not re-examined.   IMPRESSION:  1. Longstanding Crohn's colitis.  Significant active inflammation most      recent colonoscopy in January of 2008.  Normal TPMT enzyme activity      with subtherapeutic 6-TGN metabolites.  Mild elevation of 6-MMPN      metabolites with normal liver tests.  2. Mild elevation of glucose on basic metabolic panel.  Clinical      significant uncertain.   RECOMMENDATIONS:  We discussed today the possible risks, as well  as  benefits, of increasing her 6-mercaptopurine dosage.  Given her enzymes  levels, as discussed above, I would like to increase her dosage from 75  mg daily to 100 mg daily.  Will have her CBC checked very two weeks for  the time being.  I would also like to check hemoglobin A1c at the time  of her next blood draw.  If this is elevated then she should see Dr.  Burnett Harry.  Finally, I would plan on routine GI followup in about 6 months  and repeat routine colonoscopy in January of 2010 unless otherwise  clinically indicated.  I did discuss with her possible complications of  therapy including pancreatitis, hepatotoxicity, leukopenia, and their  sequelae.  She understood and had all questions answered to her  satisfaction.     Docia Chuck. Henrene Pastor, MD  Electronically Signed   JNP/MedQ  DD: 03/14/2008  DT: 03/14/2008  Job #: [id number]   cc:   Monia Sabal. Corinna Capra, M.D.  Zella Richer Burnett Harry, M.D.

## 2011-04-12 NOTE — Op Note (Signed)
[facility]  Patient:    [name] [name], [name] [name] Visit Number: [id number] MRN: [id number]          Service Type: GYN Location: [place] 01 Attending Physician:  Jerald Kief Dictated by:   Jerald Kief, M.D. Proc. Date: 01/22/02 Admit Date:  01/22/2002                             Operative Report  PREOPERATIVE DIAGNOSES:       Abnormal uterine bleeding, endometrial mass, pelvic pressure, known fibroids, bilateral ovarian cysts.  POSTOPERATIVE DIAGNOSES:      Abnormal uterine bleeding, endometrial mass, pelvic pressure, known fibroids, bilateral ovarian cysts, severe pelvic endometriosis.  PROCEDURE:                    Hysteroscopy, dilatation and curettage, laparoscopy with conversion to laparotomy and subsequent total abdominal hysterectomy, left salpingo-oophorectomy, and right ovarian cystectomy.  SURGEON:                      Jerald Kief, M.D.  ASSISTANT:                    Darlyn Chamber, M.D.  ANESTHESIA:                   General endotracheal.  INDICATIONS:                  Ms. [name] [name] is a 56 year old nulligravida white female with pelvic discomfort with known ovarian cysts bilateral.  She has been on ovulation suppression for this and recent ultrasound shows that she continues to have left 6 cm cyst and right complex cyst measuring 2-3 cm in size.  She has also had abnormal uterine bleeding not responsive to the oral contraceptive agents.  Sonohysterogram showed a small mass in the endometrium. She also has had known fibroids measuring 10 weeks size which cause occasional pelvic pain.  She presents for definitive surgical evaluation and treatment, planned hysteroscopy and D&C, laparoscopy with ovarian preservation if possible, and ovarian cystectomy bilaterally.  Risks and benefits were discussed at length which include, but not limited to, risk if infection, bleeding, damage to bowel, bladder, uterus, tubes, ovaries,  possibility of not being able to preserve the ovaries or uterus, risks associated with blood transfusion, infection.  The patient does give her informed consent.  FINDINGS:                     Small thickening in the anterior endometrium on hysteroscopy and D&C.  No obvious endometrial polyps.  Normal appearing ostia. On laparoscopy she has a large 10-12 week sized fibroid uterus which is densely adhesed to the bowel as well as bilateral ovaries are densely adhered to the posterior cul-de-sac with large endometriomas bilaterally.  Normal appearing appendix.  DESCRIPTION OF PROCEDURE:     After adequate analgesia the patient was placed in the dorsal lithotomy position.  She is sterilely prepped and draped. Bladder was sterilely drained.  Graves speculum was placed.  Tenaculum was placed on the anterior lip of the cervix.  The uterus was sounded to 8 cm. Easily dilated to a #25 Hegar dilator.  The hysteroscope was inserted.  Small thickening on the anterior surface of the uterus was seen.  No obvious polypoid lesion was identified.  Curettage was performed removing this endometrial thickening.  After curettage was performed  reexamination with the hysteroscope revealed normal appearing endometrium with no pathology and normal appearing ostia.  Cohen tenaculum was then placed on the cervix.  A 1 cm infraumbilical skin incision was made.  A Verres needle was inserted.  The abdomen was insufflated to dullness to percussion.  An 11 mm trocar was inserted and the above findings were noted.  Also at the time of entrance of the trocar, the top portion of the trocar did touch the fibroid which was projecting from the fundus to cause a small amount of bleeding which was difficult to control with bipolar cautery, however, was not ______ to be hemorrhagic.  Because of the ______ pelvis, large bilateral endometriomas, nature of the pelvic adhesions, and large uterine size as well as some bleeding from  the fundal fibroid, felt that hysterectomy and ovarian preservation at least on the left side would not be able to be accomplished and definitely could not be accomplished laparoscopically.  At this time the abdomen was desufflated.  I left the operating room and spoke with the patients mother, described what was going on, and she agreed that we need to proceed with a hysterectomy.  At that time I returned to the OR, rescrubbed, and proceeded with a hysterectomy with the assistance of Dr. Arvella Nigh. Laparotomy was performed two fingerbreadths above the pubic symphysis and a Pfannenstiel skin incision was taken down sharply.  The fascia was incised transversely, extended superiorly and inferiorly off the bellies of the rectus muscles, separated sharply in the midline.  Peritoneum was then entered sharply.  OConnor-O-Sullivan retractor was placed.  The uterus was elevated into the incision.  Kelly clamps placed across the utero-ovarian pedicles. Again, a large approximately 8 cm fibroid off the fundus had a small amount of bleeding from the trocar insertion site.  However, there were multiple fibroids making this a globular uterus, very difficult to identify the tubes and round ligament.  Similarly, the posterior cul-de-sac was frozen with the ovaries bilaterally stuck posteriorly.  They were bluntly dissected with finger dissection and also sharp dissection with Metzenbaum scissors. Bilateral endometriomas were noted and rupture did occur during the dissection.  Copious amount of irrigation was applied.  At this time the round ligaments were identified bilaterally, ligated with the LigaSure instrument with good seal noted.  Hysterectomy was carried out using the LigaSure instrument across the utero-ovarian ligaments bilaterally.  The bladder was dissected off the anterior surface of the cervix and the bilateral uterine vasculature were ligated with LigaSure.  At this time the fibroids and  uterine fundus were sharply removed.  The cervix was grasped with a tenaculum.  The bladder was dissected off the anterior surface of the cervix and at this point  the cardinal ligaments were clamped and ligated and the uterosacral ligaments were then clamped and ligated.  The vagina was entered and the remaining portion of the cervix was removed and sent off as uterine specimen.  Vagina was then closed with 0 Monocryl in a figure-of-eight fashion with good approximation, good hemostasis.  The left ovary was then grasped with Babcock clamps, dissected free from the bowel, posterior cul-de-sac, and pelvic side wall.  The ureter was identified. After identifying the ureter the infundibulopelvic ligament was then ligated and the entire left ovary was removed.  Hemostasis was achieved with Bovie cautery.  The right ovary was approximately 3-4 cm in size and had several small endometriomas within.  Ovarian cystectomy was performed because of the patients desire for ovarian preservation.  The endometrioma shell was then dissected out.  Cauterization was carried out with Bovie cautery.  At this time no residual endometriosis was identified in the right ovary and the ovary was closed with 4-0 PDS in a running fashion and the ovary was suspected to the round ligament with 4-0 PDS.  Copious amount of irrigation was applied. Hemostasis was achieved with Bovie cautery on omentum and pelvic side walls from the massive induration from the endometriosis.  Reexamination of the ureters revealed no obvious damage to the ureters.  The right ovary did appear to be normal at this point.  The appendix was also identified as normal.  At this time the peritoneum was then closed with 2-0 Vicryl.  The irrigation was once again applied and this time the fascia was closed with a 0 PDS in a running fashion.  Irrigation was once again applied and after adequate hemostasis the skin was stapled and Steri-Strips applied.   The infraumbilical skin incision was closed with a 0 Vicryl ______ needle in the fascia, 3-0 Vicryl Rapide subcuticular stitch with good approximation, good hemostasis. X-ray was performed since the count was not performed before the total abdominal hysterectomy and no retained sponges were noted.  The patient was stable upon transfer to the recovery room.  The sponge and instrument count was normal x3.  Patient received 2 g of Cefotetan preoperatively.  Estimated blood loss from the procedure was 400 cc. Dictated by:   Jerald Kief, M.D. Attending Physician:  Jerald Kief DD:  01/22/02 TD:  01/22/02 Job: [id number]

## 2011-04-12 NOTE — H&P (Signed)
[facility]  Patient:    [name] [name], [name] [name] Visit Number: [id number] MRN: [id number]          Service Type: Attending:  Jerald Kief, M.D. Dictated by:   Jerald Kief, M.D. Adm. Date:  01/22/02                           History and Physical  HISTORY OF PRESENT ILLNESS:  Ms. Ishmael Holter is a 55 year old nulligravida white female with history of abnormal uterine bleeding, pelvic pain and bilateral ovarian cyst.  She has been on ovulation suppression for the last three to four months; has tolerated it well.  She has had some improvement with back pain, also has had a history of Crohns disease which has been under control for the last several months.  On ultrasound mid-January she continues to have a 6 cm cyst, which measures 6.4 cm x 6.1 x 5.8, with irregular borders and protrusions into the cyst wall.  This is on the left side.  The right ovary has a complex-appearing cyst with irregular thick walls.  The uterus does show several fibroids, continues to be enlarged.  Sonohistogram shows a small calcified mass which measures 4.7 mm in size.  The patient presents for definitive surgical evaluation, treatment of the ovarian cyst and the endometrial mass with abnormal uterine bleeding.  PAST MEDICAL HISTORY:  Crohns disease, followed by Jim Desanctis, M.D.  PAST SURGICAL HISTORY:  Adenoids removed when she was [age].  PAST GYNECOLOGICAL HISTORY:  Known fibroids for a number of years and abnormal bleeding.  Has been on and off birth control pills and IUD in the past.  MEDICATIONS:  Purinethol.  ALLERGIES:  No known drug allergies.  PHYSICAL EXAMINATION:  VITAL SIGNS:  Blood pressure 112/72.  HEART:  Regular rate and rhythm.  LUNGS:  Clear to auscultation bilaterally.  ABDOMEN:  Nondistended and nontender.  PELVIC:  Uterus is anteverted, mobile; approximately 8-10 week size.  Mildly tender to deep palpation on left adnexa, with some mild guarding.   No rebound.   IMPRESSION AND PLAN:  Bilateral ovarian cyst -- left side 6 cm, irregular border.  Normal CA125.  Not responsive to ovarian suppression with oral contraceptive agents.  Right ovary also has several small cysts as well. Recommend laparoscopic evaluation with left ovarian and probable right ovarian cystectomy; possible removal of the tubes and ovaries.  The patient adamantly desires preservation of both ovaries if at all possible.  She also has endometrial mass and small calcification of the fundus, with abnormal uterine bleeding.  This may represent polyp or a fibroid.  She does have known fibroids.  Recommended to have hysteroscopy D&C for evaluation and treatment of this.  The risks and benefits of the above procedures were discussed at length with the patient, which includes but not limited to: risks of infection, bleeding, damage to uterus, tubes, ovaries, bowel or bladder; recurrence of endometrial mass or abnormal bleeding, possibility that this is cancer that appropriate staging needs to be performed, old recurrence of ovarian cyst, also risks associated with the surgery which included infection, bleeding, damage to uterus, tubes, ovaries, bowel or bladder; risks of surgical blood transfusion.  She does give her informed consent. Dictated by:   Jerald Kief, M.D. Attending:  Jerald Kief, M.D. DD:  01/21/02 TD:  01/21/02 Job: [id number]

## 2011-04-12 NOTE — Discharge Summary (Signed)
[facility]  Patient:    [name] [name], [name] [name] Visit Number: [id number] MRN: [id number]          Service Type: GYN Location: [place] 01 Attending Physician:  Jerald Kief Dictated by:   Jerald Kief, M.D. Admit Date:  01/22/2002 Discharge Date: 01/24/2002                             Discharge Summary  HISTORY OF PRESENT ILLNESS:   [name] [name] is a 55 year old nulligravida white female with history of abnormal uterine bleeding, pelvic pain, bilateral ovarian cysts on ovulation suppression for the last three to four months. She continued to have some back pain and she also has a history of Crohns disease. Ultrasound in mid January shows the persistence of a 6-cm cyst measuring 6.4 cm in size, irregular borders, protrusions at the cyst wall, borderline elevated CA-125. Right ovary also shows a complex appearing cyst. She also has known 10-week size fibroids. Also, sonohysterogram showing a calcified mass within the uterus. She presented for definitive surgical evaluation and treatment.  HOSPITAL COURSE:              The patient underwent a planned hysteroscopy/D&C and laparoscopy. The hysteroscopy/D&C was without complications except she did have a mild thickening of the anterior endometrial wall which was removed with a curettage. Upon laparoscopy, she had a frozen pelvis due to large left-sided endometrioma and right-sided endometrioma with multiple fibroids which were encased with endometriosis and also scarred with omentum and bowel. Upon trying to remove some of the adhesions, due to the vascular nature of the fibroids, the patient began having bleeding from fibroids during the evaluation of the pelvis. Because of this, laparoscopic approach was abandoned. I spoke with the patients mother who agreed that laparotomy with hysterectomy and left salpingo-oophorectomy was warranted. We would try to preserve the right ovary. I returned to the  operating room and during a laparotomy through a Pfannenstiel skin incision, lysis of adhesions was performed. Total abdominal hysterectomy with left salpingo-oophorectomy and right ovarian cystectomy was carried out, which was uncomplicated; however, difficult dissections because of the extensive nature of the endometriosis. The estimated blood loss during the procedure was 400 cc. The remaining portion of the right ovary after right ovarian cystectomy appeared to be normal and have good blood flow. Normal appearing appendix.  The patients postoperative course was unremarkable and actually she recovered very well. She remained on Cefotetan for 24 hours, remained afebrile, had good urine output, and by postoperative day #2 was tolerating a regular diet, ambulating without difficulty, had normal active bowel sounds, and was discharged home with followup in the office in two to three days for staple removal. Her postoperative hemoglobin on postoperative day #1 was 9.1.  DISPOSITION:                  The patient is to be discharged home.  DISCHARGE FOLLOWUP:           The patient is to follow up in the office in the next several days for staple removal and in two weeks for incision check.  DISCHARGE MEDICATIONS:        The patient is sent home with prescription for Tylox, #30, and Motrin 800 mg, #30. Dictated by:   Jerald Kief, M.D. Attending Physician:  Jerald Kief DD:  01/24/02 TD:  01/25/02 Job: [id number]

## 2011-04-12 NOTE — Assessment & Plan Note (Signed)
[facility]                         GASTROENTEROLOGY OFFICE NOTE   NAME:[name], [name] LACOUR                        MRN:          [id number]  DATE:12/17/2006                            DOB:          June 30, 1956    [name] comes in on January 23rd.  She has known Crohn's disease.  She is  due for a colonoscopy examination.  She is taking 6-MP 75 mg a day,  along with multivitamins and calcium.  Otherwise she feels fine.   PHYSICAL EXAMINATION:  She weighs 207. Blood pressure 120/72, pulse 84  and regular.  The neck, heart, extremities are unremarkable.   IMPRESSION:  Crohn's disease as described, doing well on 6-MP.   RECOMMENDATIONS:  Get routine blood studies and schedule her for full  colonoscopy examination and followup.     Clarene Reamer, MD  Electronically Signed    SML/MedQ  DD: 12/17/2006  DT: 12/17/2006  Job #: [id number]

## 2011-12-23 DIAGNOSIS — Z0289 Encounter for other administrative examinations: Secondary | ICD-10-CM

## 2012-01-07 ENCOUNTER — Encounter: Payer: Self-pay | Admitting: Internal Medicine

## 2013-08-02 ENCOUNTER — Other Ambulatory Visit: Payer: Self-pay | Admitting: Obstetrics and Gynecology

## 2013-08-02 DIAGNOSIS — N6002 Solitary cyst of left breast: Secondary | ICD-10-CM

## 2013-08-04 ENCOUNTER — Other Ambulatory Visit: Payer: Self-pay | Admitting: Obstetrics and Gynecology

## 2013-08-04 ENCOUNTER — Ambulatory Visit
Admission: RE | Admit: 2013-08-04 | Discharge: 2013-08-04 | Disposition: A | Discharge status: Home / Self Care | Payer: BC Managed Care – PPO | Source: Ambulatory Visit | Attending: Obstetrics and Gynecology | Admitting: Obstetrics and Gynecology

## 2013-08-04 DIAGNOSIS — N6002 Solitary cyst of left breast: Secondary | ICD-10-CM

## 2013-08-16 ENCOUNTER — Ambulatory Visit
Admission: RE | Admit: 2013-08-16 | Discharge: 2013-08-16 | Disposition: A | Discharge status: Home / Self Care | Payer: BC Managed Care – PPO | Source: Ambulatory Visit | Attending: Obstetrics and Gynecology | Admitting: Obstetrics and Gynecology

## 2013-08-16 DIAGNOSIS — N6002 Solitary cyst of left breast: Secondary | ICD-10-CM

## 2013-08-17 ENCOUNTER — Other Ambulatory Visit: Payer: Self-pay | Admitting: Obstetrics and Gynecology

## 2013-08-17 DIAGNOSIS — R921 Mammographic calcification found on diagnostic imaging of breast: Secondary | ICD-10-CM

## 2013-08-19 ENCOUNTER — Ambulatory Visit
Admission: RE | Admit: 2013-08-19 | Discharge: 2013-08-19 | Disposition: A | Discharge status: Home / Self Care | Payer: BC Managed Care – PPO | Source: Ambulatory Visit | Attending: Obstetrics and Gynecology | Admitting: Obstetrics and Gynecology

## 2013-08-19 DIAGNOSIS — R921 Mammographic calcification found on diagnostic imaging of breast: Secondary | ICD-10-CM

## 2013-08-23 ENCOUNTER — Ambulatory Visit (INDEPENDENT_AMBULATORY_CARE_PROVIDER_SITE_OTHER): Payer: Self-pay | Admitting: General Surgery

## 2013-08-24 ENCOUNTER — Encounter (INDEPENDENT_AMBULATORY_CARE_PROVIDER_SITE_OTHER): Payer: Self-pay | Admitting: Surgery

## 2013-08-24 ENCOUNTER — Telehealth (INDEPENDENT_AMBULATORY_CARE_PROVIDER_SITE_OTHER): Payer: Self-pay | Admitting: Surgery

## 2013-08-24 ENCOUNTER — Ambulatory Visit (INDEPENDENT_AMBULATORY_CARE_PROVIDER_SITE_OTHER): Payer: BC Managed Care – PPO | Admitting: Surgery

## 2013-08-24 VITALS — BP 118/64 | HR 70 | Resp 16 | Ht 63.0 in | Wt 191.8 lb

## 2013-08-24 DIAGNOSIS — N6310 Unspecified lump in the right breast, unspecified quadrant: Secondary | ICD-10-CM

## 2013-08-24 DIAGNOSIS — N63 Unspecified lump in unspecified breast: Secondary | ICD-10-CM

## 2013-08-24 HISTORY — DX: Unspecified lump in the right breast, unspecified quadrant: N63.10

## 2013-08-24 NOTE — Progress Notes (Signed)
Patient ID: [name] [name], female   DOB: [birth year], 57 y.o.   MRN: [id number]  Chief Complaint  Patient presents with  . Breast Problem    eval lesion on rt br    HPI [name] [name] is a 57 y.o. female.  She had a routine mammogram done in some abnormalities were found in the right breast, basically calcifications. Needle core biopsies were done both. One is fibrocystic changes with sclerosing adenosis, the other shows a complex sclerosing lesion. There is no evidence of a cancer. She's not had any breast symptoms such as nipple discharge pain or skin changes etc. She's never had prior breast problems. Her only family history is a paternal aunt who had breast cancer late in life.  HPI  Past Medical History  Diagnosis Date  . Crohn disease     Past Surgical History  Procedure Laterality Date  . Abdominal hysterectomy    . Adnoids removed      Family History  Problem Relation Age of Onset  . Cancer Father     lung  . Cancer Paternal Aunt     breast    Social History History  Substance Use Topics  . Smoking status: Former Smoker    Types: Cigarettes    Quit date: 08/24/1988  . Smokeless tobacco: Never Used  . Alcohol Use: Yes    No Known Allergies  Current Outpatient Prescriptions  Medication Sig Dispense Refill  . calcium-vitamin D 250-100 MG-UNIT per tablet Take 1 tablet by mouth 2 (two) times daily.      . Multiple Vitamin (MULTIVITAMIN) tablet Take 1 tablet by mouth daily.      . niacin 100 MG tablet Take 100 mg by mouth daily with breakfast.      . vitamin B-12 (CYANOCOBALAMIN) 100 MCG tablet Take 50 mcg by mouth daily.       No current facility-administered medications for this visit.    Review of Systems Review of Systems  Constitutional: Negative for fever, chills and unexpected weight change.  HENT: Negative for hearing loss, congestion, sore throat, trouble swallowing and voice change.   Eyes: Negative for visual disturbance.  Respiratory: Negative for  cough and wheezing.   Cardiovascular: Negative for chest pain, palpitations and leg swelling.  Gastrointestinal: Negative for nausea, vomiting, abdominal pain, diarrhea, constipation, blood in stool, abdominal distention and anal bleeding.  Genitourinary: Negative for hematuria, vaginal bleeding and difficulty urinating.  Musculoskeletal: Negative for arthralgias.  Skin: Negative for rash and wound.  Neurological: Negative for seizures, syncope and headaches.  Hematological: Negative for adenopathy. Does not bruise/bleed easily.  Psychiatric/Behavioral: Negative for confusion.    Blood pressure 118/64, pulse 70, resp. rate 16, height 5' 3"  (1.6 m), weight 191 lb 12.8 oz (87 kg).  Physical Exam Physical Exam  Vitals reviewed. Constitutional: She is oriented to person, place, and time. She appears well-developed and well-nourished. No distress.  HENT:  Head: Normocephalic and atraumatic.  Mouth/Throat: Oropharynx is clear and moist.  Eyes: Conjunctivae and EOM are normal. Pupils are equal, round, and reactive to light. No scleral icterus.  Neck: Normal range of motion. Neck supple. No tracheal deviation present. No thyromegaly present.  Cardiovascular: Normal rate, regular rhythm, normal heart sounds and intact distal pulses.  Exam reveals no gallop and no friction rub.   No murmur heard. Pulmonary/Chest: Effort normal and breath sounds normal. No respiratory distress. She has no wheezes. She has no rales. Right breast exhibits no inverted nipple, no mass, no nipple discharge,  no skin change and no tenderness. Left breast exhibits no inverted nipple, no mass, no nipple discharge, no skin change and no tenderness. Breasts are symmetrical.    Abdominal: Soft. Bowel sounds are normal. She exhibits no distension and no mass. There is no tenderness. There is no rebound and no guarding.  Musculoskeletal: Normal range of motion. She exhibits no edema and no tenderness.  Lymphadenopathy:    She  has no cervical adenopathy.    She has no axillary adenopathy.       Right: No supraclavicular adenopathy present.       Left: No supraclavicular adenopathy present.  Neurological: She is alert and oriented to person, place, and time.  Skin: Skin is warm and dry. No rash noted. She is not diaphoretic. No erythema.  Psychiatric: She has a normal mood and affect. Her behavior is normal. Judgment and thought content normal.    Data Reviewed I have reviewed the mammogram reports and pathology reports. One of the areas is benign fibrocystic and the other shows a complex sclerosing lesion  Assessment    Fibrocystic changes with an area of a complex sclerosing lesion 12:00 position right breast     Plan    I've recommended wire localized excisional biopsy.  I have discussed the indications for the lumpectomy and described the procedure. She understand that the chance of removal of the abnormal area is very good, but that occasionally we are unable to locate it and may have to do a second procedure. We also discussed the possibility of a second procedure to get additional tissue. Risks of surgery such as bleeding and infection have also been explained, as well as the implications of not doing the surgery. She understands and wishes to proceed.         Deborha Moseley J 08/24/2013, 4:24 PM

## 2013-08-24 NOTE — Patient Instructions (Signed)
We will schedule outpatient surgery to remove the abnormal area in the right breast

## 2013-08-24 NOTE — Telephone Encounter (Signed)
Went over pt financial responsibilities and she will call back to schedule. Placed in pending folder

## 2013-11-05 ENCOUNTER — Encounter (HOSPITAL_BASED_OUTPATIENT_CLINIC_OR_DEPARTMENT_OTHER): Payer: Self-pay | Admitting: *Deleted

## 2013-11-08 ENCOUNTER — Encounter (HOSPITAL_BASED_OUTPATIENT_CLINIC_OR_DEPARTMENT_OTHER): Payer: Self-pay | Admitting: *Deleted

## 2013-11-08 NOTE — Progress Notes (Signed)
No labs needed

## 2013-11-10 ENCOUNTER — Encounter (HOSPITAL_BASED_OUTPATIENT_CLINIC_OR_DEPARTMENT_OTHER)
Admission: RE | Disposition: A | Discharge status: Home / Self Care | Payer: Self-pay | Source: Ambulatory Visit | Attending: Surgery

## 2013-11-10 ENCOUNTER — Ambulatory Visit (HOSPITAL_BASED_OUTPATIENT_CLINIC_OR_DEPARTMENT_OTHER): Payer: BC Managed Care – PPO | Admitting: *Deleted

## 2013-11-10 ENCOUNTER — Ambulatory Visit
Admission: RE | Admit: 2013-11-10 | Discharge: 2013-11-10 | Disposition: A | Discharge status: Home / Self Care | Payer: BC Managed Care – PPO | Source: Ambulatory Visit | Attending: Surgery | Admitting: Surgery

## 2013-11-10 ENCOUNTER — Encounter (HOSPITAL_BASED_OUTPATIENT_CLINIC_OR_DEPARTMENT_OTHER): Payer: BC Managed Care – PPO | Admitting: *Deleted

## 2013-11-10 ENCOUNTER — Ambulatory Visit (HOSPITAL_BASED_OUTPATIENT_CLINIC_OR_DEPARTMENT_OTHER)
Admission: RE | Admit: 2013-11-10 | Discharge: 2013-11-10 | Disposition: A | Discharge status: Home / Self Care | Payer: BC Managed Care – PPO | Source: Ambulatory Visit | Attending: Surgery | Admitting: Surgery

## 2013-11-10 ENCOUNTER — Encounter (HOSPITAL_BASED_OUTPATIENT_CLINIC_OR_DEPARTMENT_OTHER): Payer: Self-pay | Admitting: *Deleted

## 2013-11-10 DIAGNOSIS — N6019 Diffuse cystic mastopathy of unspecified breast: Secondary | ICD-10-CM | POA: Insufficient documentation

## 2013-11-10 DIAGNOSIS — Z803 Family history of malignant neoplasm of breast: Secondary | ICD-10-CM | POA: Insufficient documentation

## 2013-11-10 DIAGNOSIS — R920 Mammographic microcalcification found on diagnostic imaging of breast: Secondary | ICD-10-CM

## 2013-11-10 DIAGNOSIS — N6029 Fibroadenosis of unspecified breast: Secondary | ICD-10-CM | POA: Insufficient documentation

## 2013-11-10 DIAGNOSIS — N6310 Unspecified lump in the right breast, unspecified quadrant: Secondary | ICD-10-CM

## 2013-11-10 HISTORY — DX: Encounter for fitting and adjustment of spectacles and contact lenses: Z46.0

## 2013-11-10 HISTORY — PX: BREAST LUMPECTOMY WITH NEEDLE LOCALIZATION: SHX5759

## 2013-11-10 LAB — POCT HEMOGLOBIN-HEMACUE: Hemoglobin: 12.5 g/dL (ref 12.0–15.0)

## 2013-11-10 SURGERY — BREAST LUMPECTOMY WITH NEEDLE LOCALIZATION
Anesthesia: General | Laterality: Right

## 2013-11-10 MED ORDER — CHLORHEXIDINE GLUCONATE 4 % EX LIQD: 1.0000 "application " | Freq: Once | CUTANEOUS | Status: DC

## 2013-11-10 MED ORDER — FENTANYL CITRATE 0.05 MG/ML IJ SOLN: 50.0000 ug | INTRAMUSCULAR | Status: DC | PRN

## 2013-11-10 MED ORDER — LIDOCAINE HCL (PF) 1 % IJ SOLN
INTRAMUSCULAR | Status: AC
  Filled 2013-11-10: qty 30

## 2013-11-10 MED ORDER — BUPIVACAINE HCL (PF) 0.25 % IJ SOLN
INTRAMUSCULAR | Status: DC | PRN
  Administered 2013-11-10: 20 mL

## 2013-11-10 MED ORDER — DEXAMETHASONE SODIUM PHOSPHATE 4 MG/ML IJ SOLN
INTRAMUSCULAR | Status: DC | PRN
  Administered 2013-11-10: 10 mg via INTRAVENOUS

## 2013-11-10 MED ORDER — CEFAZOLIN SODIUM-DEXTROSE 2-3 GM-% IV SOLR
2.0000 g | INTRAVENOUS | Status: AC
  Administered 2013-11-10: 2 g via INTRAVENOUS

## 2013-11-10 MED ORDER — MIDAZOLAM HCL 5 MG/5ML IJ SOLN
INTRAMUSCULAR | Status: DC | PRN
  Administered 2013-11-10: 2 mg via INTRAVENOUS

## 2013-11-10 MED ORDER — OXYCODONE HCL 5 MG PO TABS
ORAL_TABLET | ORAL | Status: AC
  Filled 2013-11-10: qty 1

## 2013-11-10 MED ORDER — CEFAZOLIN SODIUM-DEXTROSE 2-3 GM-% IV SOLR
INTRAVENOUS | Status: AC
  Filled 2013-11-10: qty 50

## 2013-11-10 MED ORDER — OXYCODONE HCL 5 MG/5ML PO SOLN
5.0000 mg | Freq: Once | ORAL | Status: AC | PRN
Start: 2013-11-10 — End: 2013-11-10

## 2013-11-10 MED ORDER — OXYCODONE HCL 5 MG PO TABS
5.0000 mg | ORAL_TABLET | Freq: Once | ORAL | Status: AC | PRN
Start: 2013-11-10 — End: 2013-11-10
  Administered 2013-11-10: 5 mg via ORAL

## 2013-11-10 MED ORDER — HYDROCODONE-ACETAMINOPHEN 5-325 MG PO TABS: 1.0000 | ORAL_TABLET | ORAL | Status: DC | PRN

## 2013-11-10 MED ORDER — ONDANSETRON HCL 4 MG/2ML IJ SOLN
INTRAMUSCULAR | Status: DC | PRN
  Administered 2013-11-10: 4 mg via INTRAVENOUS

## 2013-11-10 MED ORDER — FENTANYL CITRATE 0.05 MG/ML IJ SOLN
INTRAMUSCULAR | Status: DC | PRN
  Administered 2013-11-10: 100 ug via INTRAVENOUS
  Administered 2013-11-10: 50 ug via INTRAVENOUS

## 2013-11-10 MED ORDER — HYDROMORPHONE HCL PF 1 MG/ML IJ SOLN: 0.2500 mg | INTRAMUSCULAR | Status: DC | PRN

## 2013-11-10 MED ORDER — MIDAZOLAM HCL 2 MG/2ML IJ SOLN
INTRAMUSCULAR | Status: AC
  Filled 2013-11-10: qty 2

## 2013-11-10 MED ORDER — PROPOFOL 10 MG/ML IV BOLUS
INTRAVENOUS | Status: DC | PRN
  Administered 2013-11-10: 200 mg via INTRAVENOUS

## 2013-11-10 MED ORDER — BUPIVACAINE-EPINEPHRINE PF 0.5-1:200000 % IJ SOLN
INTRAMUSCULAR | Status: AC
  Filled 2013-11-10: qty 30

## 2013-11-10 MED ORDER — FENTANYL CITRATE 0.05 MG/ML IJ SOLN
INTRAMUSCULAR | Status: AC
  Filled 2013-11-10: qty 4

## 2013-11-10 MED ORDER — PROMETHAZINE HCL 25 MG/ML IJ SOLN: 6.2500 mg | INTRAMUSCULAR | Status: DC | PRN

## 2013-11-10 MED ORDER — BUPIVACAINE HCL (PF) 0.25 % IJ SOLN
INTRAMUSCULAR | Status: AC
  Filled 2013-11-10: qty 30

## 2013-11-10 MED ORDER — MIDAZOLAM HCL 2 MG/2ML IJ SOLN: 1.0000 mg | INTRAMUSCULAR | Status: DC | PRN

## 2013-11-10 MED ORDER — LIDOCAINE HCL (CARDIAC) 20 MG/ML IV SOLN
INTRAVENOUS | Status: DC | PRN
  Administered 2013-11-10: 100 mg via INTRAVENOUS

## 2013-11-10 MED ORDER — LACTATED RINGERS IV SOLN
INTRAVENOUS | Status: DC
  Administered 2013-11-10: 09:00:00 via INTRAVENOUS

## 2013-11-10 SURGICAL SUPPLY — 49 items
APPLICATOR COTTON TIP 6IN STRL (MISCELLANEOUS) IMPLANT
BINDER BREAST LRG (GAUZE/BANDAGES/DRESSINGS) ×2 IMPLANT
BINDER BREAST MEDIUM (GAUZE/BANDAGES/DRESSINGS) IMPLANT
BINDER BREAST XLRG (GAUZE/BANDAGES/DRESSINGS) IMPLANT
BINDER BREAST XXLRG (GAUZE/BANDAGES/DRESSINGS) IMPLANT
BLADE HEX COATED 2.75 (ELECTRODE) ×2 IMPLANT
BLADE SURG 15 STRL LF DISP TIS (BLADE) ×1 IMPLANT
BLADE SURG 15 STRL SS (BLADE) ×1
CANISTER SUCT 1200ML W/VALVE (MISCELLANEOUS) ×2 IMPLANT
CHLORAPREP W/TINT 26ML (MISCELLANEOUS) ×2 IMPLANT
CLIP TI MEDIUM 6 (CLIP) IMPLANT
CLIP TI WIDE RED SMALL 6 (CLIP) IMPLANT
COVER MAYO STAND STRL (DRAPES) ×2 IMPLANT
COVER TABLE BACK 60X90 (DRAPES) ×2 IMPLANT
DECANTER SPIKE VIAL GLASS SM (MISCELLANEOUS) ×2 IMPLANT
DERMABOND ADVANCED (GAUZE/BANDAGES/DRESSINGS) ×1
DERMABOND ADVANCED .7 DNX12 (GAUZE/BANDAGES/DRESSINGS) ×1 IMPLANT
DEVICE DUBIN W/COMP PLATE 8390 (MISCELLANEOUS) ×2 IMPLANT
DRAPE LAPAROTOMY TRNSV 102X78 (DRAPE) ×2 IMPLANT
DRAPE SURG 17X23 STRL (DRAPES) IMPLANT
DRAPE UTILITY XL STRL (DRAPES) ×2 IMPLANT
ELECT REM PT RETURN 9FT ADLT (ELECTROSURGICAL) ×2
ELECTRODE REM PT RTRN 9FT ADLT (ELECTROSURGICAL) ×1 IMPLANT
GLOVE BIOGEL M STRL SZ7.5 (GLOVE) ×2 IMPLANT
GLOVE BIOGEL PI IND STRL 7.0 (GLOVE) ×1 IMPLANT
GLOVE BIOGEL PI IND STRL 8 (GLOVE) ×1 IMPLANT
GLOVE BIOGEL PI INDICATOR 7.0 (GLOVE) ×1
GLOVE BIOGEL PI INDICATOR 8 (GLOVE) ×1
GLOVE EUDERMIC 7 POWDERFREE (GLOVE) ×2 IMPLANT
GOWN PREVENTION PLUS XLARGE (GOWN DISPOSABLE) ×2 IMPLANT
GOWN PREVENTION PLUS XXLARGE (GOWN DISPOSABLE) ×2 IMPLANT
KIT MARKER MARGIN INK (KITS) ×2 IMPLANT
NEEDLE HYPO 25X1 1.5 SAFETY (NEEDLE) ×2 IMPLANT
NS IRRIG 1000ML POUR BTL (IV SOLUTION) IMPLANT
PACK BASIN DAY SURGERY FS (CUSTOM PROCEDURE TRAY) ×2 IMPLANT
PENCIL BUTTON HOLSTER BLD 10FT (ELECTRODE) ×2 IMPLANT
SHEET MEDIUM DRAPE 40X70 STRL (DRAPES) ×2 IMPLANT
SLEEVE SCD COMPRESS KNEE MED (MISCELLANEOUS) ×2 IMPLANT
SPONGE GAUZE 4X4 12PLY (GAUZE/BANDAGES/DRESSINGS) IMPLANT
SPONGE INTESTINAL PEANUT (DISPOSABLE) IMPLANT
SPONGE LAP 4X18 X RAY DECT (DISPOSABLE) ×2 IMPLANT
STAPLER VISISTAT 35W (STAPLE) IMPLANT
SUT MNCRL AB 4-0 PS2 18 (SUTURE) ×2 IMPLANT
SUT SILK 0 TIES 10X30 (SUTURE) IMPLANT
SUT VICRYL 3-0 CR8 SH (SUTURE) ×2 IMPLANT
SYR CONTROL 10ML LL (SYRINGE) ×2 IMPLANT
TOWEL OR NON WOVEN STRL DISP B (DISPOSABLE) IMPLANT
TUBE CONNECTING 20X1/4 (TUBING) IMPLANT
YANKAUER SUCT BULB TIP NO VENT (SUCTIONS) IMPLANT

## 2013-11-10 NOTE — OR Nursing (Signed)
Pin placed at [facility] on breast grid.

## 2013-11-10 NOTE — Anesthesia Postprocedure Evaluation (Signed)
Anesthesia Post Note  Patient: [name] [name]  Procedure(s) Performed: Procedure(s) (LRB): RIGHT BREAST MASS NEEDLE LOCALIZATION (Right)  Anesthesia type: general  Patient location: PACU  Post pain: Pain level controlled  Post assessment: Patient's Cardiovascular Status Stable  Last Vitals:  Filed Vitals:   11/10/13 1131  BP: 129/75  Pulse: 62  Temp: 36.8 C  Resp: 16    Post vital signs: Reviewed and stable  Level of consciousness: sedated  Complications: No apparent anesthesia complications

## 2013-11-10 NOTE — H&P (Signed)
NAME: [name] [name]       DOB: 09-11-1956           DATE: 08/30/2013       QMG:[id number]  HPI: [name] [name] is a 57 y.o. female. She had a routine mammogram done in some abnormalities were found in the right breast, basically calcifications. Needle core biopsies were done both. One is fibrocystic changes with sclerosing adenosis, the other shows a complex sclerosing lesion. There is no evidence of a cancer. She's not had any breast symptoms such as nipple discharge pain or skin changes etc. She's never had prior breast problems. Her only family history is a paternal aunt who had breast cancer late in life.   She presents today for wire localized excision of these areas   EXAM: Vital signs: BP 121/74  Pulse 71  Temp(Src) 98.1 F (36.7 C) (Oral)  Resp 20  Ht 5' 3"  (1.6 m)  Wt 190 lb (86.183 kg)  BMI 33.67 kg/m2  SpO2 96%  Constitutional: She is oriented to person, place, and time. She appears well-developed and well-nourished. No distress.  HENT:  Head: Normocephalic and atraumatic.  Mouth/Throat: Oropharynx is clear and moist.  Eyes: Conjunctivae and EOM are normal. Pupils are equal, round, and reactive to light. No scleral icterus.  Neck: Normal range of motion. Neck supple. No tracheal deviation present. No thyromegaly present.  Cardiovascular: Normal rate, regular rhythm, normal heart sounds and intact distal pulses. Exam reveals no gallop and no friction rub.  No murmur heard.  Pulmonary/Chest: Effort normal and breath sounds normal. No respiratory distress. She has no wheezes. She has no rales. Right breast exhibits no inverted nipple, no mass, no nipple discharge, no skin change and no tenderness. Left breast exhibits no inverted nipple, no mass, no nipple discharge, no skin change and no tenderness. Breasts are symmetrical. Breast - guide wire inplace on right  Abdominal: Soft. Bowel sounds are normal. She exhibits no distension and no mass. There is no tenderness. There is no  rebound and no guarding.  Musculoskeletal: Normal range of motion. She exhibits no edema and no tenderness.  Lymphadenopathy:  She has no cervical adenopathy.  She has no axillary adenopathy.  Right: No supraclavicular adenopathy present.  Left: No supraclavicular adenopathy present.  Neurological: She is alert and oriented to person, place, and time.  Skin: Skin is warm and dry. No rash noted. She is not diaphoretic. No erythema.  Psychiatric: She has a normal mood and affect. Her behavior is normal. Judgment and thought content normal.   IMP: Two adjacent areas of breast abnormality  PLAN: wire localized excision. I have reviewed plans again today with the patient and her husband, reviewed the wire loc films and marked the right breast as the operative side. All questions answered  [name] 11/10/2013

## 2013-11-10 NOTE — Anesthesia Procedure Notes (Signed)
Procedure Name: LMA Insertion Date/Time: 11/10/2013 9:34 AM Performed by: [name] Pre-anesthesia Checklist: Patient identified, Emergency Drugs available, Suction available and Patient being monitored Patient Re-evaluated:Patient Re-evaluated prior to inductionOxygen Delivery Method: Circle System Utilized Preoxygenation: Pre-oxygenation with 100% oxygen Intubation Type: IV induction Ventilation: Mask ventilation without difficulty LMA: LMA inserted LMA Size: 4.0 Number of attempts: 1 Airway Equipment and Method: bite block Placement Confirmation: positive ETCO2 Tube secured with: Tape Dental Injury: Teeth and Oropharynx as per pre-operative assessment

## 2013-11-10 NOTE — Anesthesia Preprocedure Evaluation (Addendum)
Anesthesia Evaluation  Patient identified by MRN, date of birth, ID band Patient awake    Reviewed: Allergy & Precautions, H&P , NPO status , Patient's Chart, lab work & pertinent test results  History of Anesthesia Complications Negative for: history of anesthetic complications  Airway Mallampati: II TM Distance: >3 FB Neck ROM: Full    Dental  (+) Teeth Intact and Dental Advisory Given   Pulmonary neg pulmonary ROS, former smoker,    Pulmonary exam normal       Cardiovascular negative cardio ROS      Neuro/Psych negative neurological ROS  negative psych ROS   GI/Hepatic negative GI ROS, Neg liver ROS,   Endo/Other  negative endocrine ROS  Renal/GU negative Renal ROS     Musculoskeletal   Abdominal   Peds  Hematology   Anesthesia Other Findings   Reproductive/Obstetrics negative OB ROS                          Anesthesia Physical Anesthesia Plan  ASA: II  Anesthesia Plan: General LMA   Post-op Pain Management:    Induction:   Airway Management Planned:   Additional Equipment:   Intra-op Plan:   Post-operative Plan: Extubation in OR  Informed Consent: I have reviewed the patients History and Physical, chart, labs and discussed the procedure including the risks, benefits and alternatives for the proposed anesthesia with the patient or authorized representative who has indicated his/her understanding and acceptance.   Dental advisory given  Plan Discussed with: Anesthesiologist, CRNA and Surgeon  Anesthesia Plan Comments:        Anesthesia Quick Evaluation

## 2013-11-10 NOTE — Transfer of Care (Signed)
Immediate Anesthesia Transfer of Care Note  Patient: [name] [name]  Procedure(s) Performed: Procedure(s): RIGHT BREAST MASS NEEDLE LOCALIZATION (Right)  Patient Location: PACU  Anesthesia Type:General  Level of Consciousness: awake, alert  and oriented  Airway & Oxygen Therapy: Patient Spontanous Breathing and Patient connected to face mask oxygen  Post-op Assessment: Report given to PACU RN, Post -op Vital signs reviewed and stable and Patient moving all extremities  Post vital signs: Reviewed and stable  Complications: No apparent anesthesia complications

## 2013-11-10 NOTE — Op Note (Signed)
[name] [name]  [birth year]  [id number]  11/10/2013   Preoperative diagnosis: Complex sclerosing lesion, right breast  Postoperative diagnosis: Same  Procedure: Wire localized excision complex sclerosing lesion right breast  Surgeon: [name], MD, FACS  Anesthesia: General  Clinical History and Indications: this patient presents for a guidewire localized excision of a Right breast Complex sclerosing lesion as diagnosed on a prior core needle biopsy.  Description of procedure: The patient was seen in the holding area and the plans for the procedure reviewed. The Right breast was marked as the operative side. The wire localizing films were reviewed.  The patient was taken to the operating room and after satisfactory general anesthesia had been obtained the Right breast was prepped and draped and the timeout was performed.  The incision was made over the presumed area of the mass. Skin flaps were raised and using cautery the area was completely excised. Bleeders were controlled with either cautery or sutures as needed.The guidewire entered and appeared with the patient supine to track deep and slightly lateral and very slightly inferior. A curvilinear incision was made just below the guidewire entry site in between 2 scars from prior biopsies. I grasped the tissue with Allis clamps after dividing about 1 cm of fatty tissue.  On review of the specimen mammogram the clip appeared to be near the medial inferior margin. I was beyond the tip of the guidewire. I took some additional tissue from the inferior medial margin and labeled separately. The main specimen was colored with ink for orientation purposes for the pathologist.  After achieving hemostasis, the incision was closed with 3-0 Vicryl, 4-0 Monocryl subcuticular, and Dermabond.  The patient tolerated the procedure well. There were no operative complications. All counts were correct.   EBL: Minimal  [name], MD,  FACS 11/10/2013 10:19 AM

## 2013-11-11 ENCOUNTER — Encounter (HOSPITAL_BASED_OUTPATIENT_CLINIC_OR_DEPARTMENT_OTHER): Payer: Self-pay | Admitting: Surgery

## 2013-11-12 ENCOUNTER — Telehealth (INDEPENDENT_AMBULATORY_CARE_PROVIDER_SITE_OTHER): Payer: Self-pay

## 2013-11-12 NOTE — Telephone Encounter (Signed)
Called and gave pt her pathology results.  She is pleased.  She is aware of her f/u appointment.

## 2013-11-12 NOTE — Telephone Encounter (Signed)
Message copied by Gweneth Fritter on Fri Nov 12, 2013 11:21 AM ------      Message from: [name]      Created: Fri Nov 12, 2013  8:11 AM       Tell her path is benign and as expected ------

## 2013-11-22 ENCOUNTER — Ambulatory Visit (INDEPENDENT_AMBULATORY_CARE_PROVIDER_SITE_OTHER): Payer: BC Managed Care – PPO | Admitting: Surgery

## 2013-11-22 ENCOUNTER — Encounter (INDEPENDENT_AMBULATORY_CARE_PROVIDER_SITE_OTHER): Payer: Self-pay | Admitting: Surgery

## 2013-11-22 VITALS — BP 126/80 | HR 64 | Temp 98.4°F | Resp 14 | Ht 63.0 in | Wt 191.6 lb

## 2013-11-22 DIAGNOSIS — N63 Unspecified lump in unspecified breast: Secondary | ICD-10-CM

## 2013-11-22 DIAGNOSIS — N6310 Unspecified lump in the right breast, unspecified quadrant: Secondary | ICD-10-CM

## 2013-11-22 DIAGNOSIS — Z09 Encounter for follow-up examination after completed treatment for conditions other than malignant neoplasm: Secondary | ICD-10-CM

## 2013-11-22 NOTE — Progress Notes (Signed)
NAME: [name] [name]                                            DOB: 19-Sep-1956 DATE: 11/22/2013                                                   MRN: [id number]  CC:  Chief Complaint  Patient presents with  . Routine Post Op    1st p/o lumpectomy    HPI: This patient comes in for post op follow-up .Sheunderwent Right lumpectomy on 11/10/13. She feels that she is doing well.  PE:  VITAL SIGNS: BP 126/80  Pulse 64  Temp(Src) 98.4 F (36.9 C) (Temporal)  Resp 14  Ht 5' 3"  (1.6 m)  Wt 191 lb 9.6 oz (86.909 kg)  BMI 33.95 kg/m2  General: The patient appears to be healthy, NAD Breast: Incisino healing nicely, no problems noted  DATA REVIEWED: Path: Diagnosis 1. Breast, lumpectomy, Right - COMPLEX SCLEROSING LESION WITH FIBROCYSTIC CHANGES AND ASSOCIATED MICROCALCIFICATIONS. - SCLEROSING ADENOSIS PRESENT. - NO ATYPIA, HYPERPLASIA, OR MALIGNANCY IDENTIFIED. 2. Breast, excision, Right additional inferior medial margin - BREAST PARENCHYMA WITH FIBROCYSTIC CHANGES AND SCLEROSING ADENOSIS. - NO ATYPIA, HYPERPLASIA, OR MALIGNANCY IDENTIFIED.  IMPRESSION: The patient is doing well S/P lumpectomy, right breast.    PLAN: RTC PRN; I gave the patient a copy of the pathology report and reviewed it with her

## 2013-11-22 NOTE — Patient Instructions (Signed)
We will see you again on an as needed basis. Please call the office at [phone] if you have any questions or concerns. Thank you for allowing us to take care of you.  

## 2013-12-09 ENCOUNTER — Encounter: Payer: Self-pay | Admitting: Internal Medicine

## 2014-02-11 ENCOUNTER — Encounter: Payer: Self-pay | Admitting: Internal Medicine

## 2014-03-30 ENCOUNTER — Ambulatory Visit (AMBULATORY_SURGERY_CENTER): Payer: Self-pay | Admitting: *Deleted

## 2014-03-30 VITALS — Ht 63.0 in | Wt 190.2 lb

## 2014-03-30 DIAGNOSIS — Z8719 Personal history of other diseases of the digestive system: Secondary | ICD-10-CM

## 2014-03-30 MED ORDER — MOVIPREP 100 G PO SOLR: ORAL | Status: DC

## 2014-03-30 NOTE — Progress Notes (Signed)
Patient denies any allergies to eggs or soy. Patient denies any problems with anesthesia/sedation. Patient denies any oxygen use at home and does not take any diet/weight loss medications. EMMI education assisgned to patient on colonoscopy.

## 2014-04-01 ENCOUNTER — Encounter: Payer: Self-pay | Admitting: Internal Medicine

## 2014-04-06 ENCOUNTER — Ambulatory Visit (AMBULATORY_SURGERY_CENTER): Payer: BC Managed Care – PPO | Admitting: Internal Medicine

## 2014-04-06 ENCOUNTER — Encounter: Payer: Self-pay | Admitting: Internal Medicine

## 2014-04-06 VITALS — BP 115/71 | HR 55 | Temp 96.8°F | Resp 19 | Ht 63.0 in | Wt 190.0 lb

## 2014-04-06 DIAGNOSIS — Z8719 Personal history of other diseases of the digestive system: Secondary | ICD-10-CM

## 2014-04-06 DIAGNOSIS — K5010 Crohn's disease of large intestine without complications: Secondary | ICD-10-CM

## 2014-04-06 DIAGNOSIS — K56699 Other intestinal obstruction unspecified as to partial versus complete obstruction: Secondary | ICD-10-CM

## 2014-04-06 MED ORDER — SODIUM CHLORIDE 0.9 % IV SOLN: 500.0000 mL | INTRAVENOUS | Status: DC

## 2014-04-06 NOTE — Op Note (Signed)
[place]  [address] [place], [id number]   COLONOSCOPY PROCEDURE REPORT  PATIENT: [name], [name]  MR#: #[id number] BIRTHDATE: 1956-06-29 , 47  yrs. old GENDER: Female ENDOSCOPIST: Eustace Quail, MD REFERRED FG:[id number] Program Recall PROCEDURE DATE:  04/06/2014 PROCEDURE:   Colonoscopy, surveillance First Screening Colonoscopy - Avg.  risk and is 50 yrs.  old or older - No.  Prior Negative Screening - Now for repeat screening. N/A  History of Adenoma - Now for follow-up colonoscopy & has been > or = to 3 yrs.  N/A  Polyps Removed Today? No.  Recommend repeat exam, <10 yrs? No. ASA CLASS:   Class II INDICATIONS:High risk patient with previously diagnosed Crohn's disease: large intestine, greater than 20 years. Last colonoscopy March 2012. Patient lost to followup. On no medical therapy. MEDICATIONS: MAC sedation, administered by CRNA and propofol (Diprivan) 219m IV  DESCRIPTION OF PROCEDURE:   After the risks benefits and alternatives of the procedure were thoroughly explained, informed consent was obtained.  A digital rectal exam revealed no abnormalities of the rectum.   The LB [id number] endoscope was introduced through the anus and advanced to the descending colon. No adverse events experienced.   The quality of the prep was excellent, using MoviPrep  The instrument was then slowly withdrawn as the colon was fully examined.  COLON FINDINGS: The colonoscope was passed per rectum and advanced approximately 40 cm at which point a high-grade chronic stricture with inflammation and ulceration was encountered.  This would not permit passage of the endoscope beyond.  In addition scattered aphthous ulcers noted in the sigmoid and rectum.  A few scattered diverticula.  Retroflexed views revealed no abnormalities. The time to cecum=  .  Withdrawal time=  .  The scope was withdrawn and the procedure completed. COMPLICATIONS: There were no  complications.  ENDOSCOPIC IMPRESSION: 1. Active Crohn's disease with high-grade stricture of the left colon. Incomplete exam 2. Mild diverticulosis.  RECOMMENDATIONS: 1. Call office for follow-up appointment in the near future to discuss these findings, treatment options, and expectations long-term   eSigned:  JEustace Quail MD 04/06/2014 10:47 AM   cc: DJacqulyn Cane MD and The Patient

## 2014-04-06 NOTE — Progress Notes (Signed)
A/ox3 pleased with MAC, report to Celia RN 

## 2014-04-06 NOTE — Patient Instructions (Signed)
Discharge instructions given with verbal understanding. Patient to call office to see Dr. Henrene Pastor to discuss future treatments. Resume previous medications. YOU HAD AN ENDOSCOPIC PROCEDURE TODAY AT [facility]: Refer to the procedure report that was given to you for any specific questions about what was found during the examination.  If the procedure report does not answer your questions, please call your gastroenterologist to clarify.  If you requested that your care partner not be given the details of your procedure findings, then the procedure report has been included in a sealed envelope for you to review at your convenience later.  YOU SHOULD EXPECT: Some feelings of bloating in the abdomen. Passage of more gas than usual.  Walking can help get rid of the air that was put into your GI tract during the procedure and reduce the bloating. If you had a lower endoscopy (such as a colonoscopy or flexible sigmoidoscopy) you may notice spotting of blood in your stool or on the toilet paper. If you underwent a bowel prep for your procedure, then you may not have a normal bowel movement for a few days.  DIET: Your first meal following the procedure should be a light meal and then it is ok to progress to your normal diet.  A half-sandwich or bowl of soup is an example of a good first meal.  Heavy or fried foods are harder to digest and may make you feel nauseous or bloated.  Likewise meals heavy in dairy and vegetables can cause extra gas to form and this can also increase the bloating.  Drink plenty of fluids but you should avoid alcoholic beverages for 24 hours.  ACTIVITY: Your care partner should take you home directly after the procedure.  You should plan to take it easy, moving slowly for the rest of the day.  You can resume normal activity the day after the procedure however you should NOT DRIVE or use heavy machinery for 24 hours (because of the sedation medicines used during the test).     SYMPTOMS TO REPORT IMMEDIATELY: A gastroenterologist can be reached at any hour.  During normal business hours, 8:30 AM to 5:00 PM Monday through Friday, call [phone].  After hours and on weekends, please call the GI answering service at [phone] who will take a message and have the physician on call contact you.   Following lower endoscopy (colonoscopy or flexible sigmoidoscopy):  Excessive amounts of blood in the stool  Significant tenderness or worsening of abdominal pains  Swelling of the abdomen that is new, acute  Fever of 100F or higher  FOLLOW UP: If any biopsies were taken you will be contacted by phone or by letter within the next 1-3 weeks.  Call your gastroenterologist if you have not heard about the biopsies in 3 weeks.  Our staff will call the home number listed on your records the next business day following your procedure to check on you and address any questions or concerns that you may have at that time regarding the information given to you following your procedure. This is a courtesy call and so if there is no answer at the home number and we have not heard from you through the emergency physician on call, we will assume that you have returned to your regular daily activities without incident.  SIGNATURES/CONFIDENTIALITY: You and/or your care partner have signed paperwork which will be entered into your electronic medical record.  These signatures attest to the fact that that the  information above on your After Visit Summary has been reviewed and is understood.  Full responsibility of the confidentiality of this discharge information lies with you and/or your care-partner.

## 2014-04-07 ENCOUNTER — Telehealth: Payer: Self-pay

## 2014-04-07 NOTE — Telephone Encounter (Signed)
  Follow up Call-  Call back number 04/06/2014  Post procedure Call Back phone  # [phone]  Permission to leave phone message Yes     Patient questions:  Do you have a fever, pain , or abdominal swelling? no Pain Score  0 *  Have you tolerated food without any problems? yes  Have you been able to return to your normal activities? yes  Do you have any questions about your discharge instructions: Diet   no Medications  no Follow up visit  no  Do you have questions or concerns about your Care? no  Actions: * If pain score is 4 or above: No action needed, pain <4.

## 2014-06-07 ENCOUNTER — Ambulatory Visit (INDEPENDENT_AMBULATORY_CARE_PROVIDER_SITE_OTHER): Payer: BC Managed Care – PPO | Admitting: Internal Medicine

## 2014-06-07 ENCOUNTER — Other Ambulatory Visit (INDEPENDENT_AMBULATORY_CARE_PROVIDER_SITE_OTHER): Payer: BC Managed Care – PPO

## 2014-06-07 ENCOUNTER — Telehealth: Payer: Self-pay | Admitting: Internal Medicine

## 2014-06-07 ENCOUNTER — Encounter: Payer: Self-pay | Admitting: Internal Medicine

## 2014-06-07 ENCOUNTER — Other Ambulatory Visit: Payer: Self-pay

## 2014-06-07 VITALS — BP 134/78 | HR 78 | Ht 63.0 in | Wt 190.0 lb

## 2014-06-07 DIAGNOSIS — K50119 Crohn's disease of large intestine with unspecified complications: Secondary | ICD-10-CM

## 2014-06-07 DIAGNOSIS — K5010 Crohn's disease of large intestine without complications: Secondary | ICD-10-CM

## 2014-06-07 LAB — CBC WITH DIFFERENTIAL/PLATELET
BASOS ABS: 0 10*3/uL (ref 0.0–0.1)
BASOS PCT: 0.3 % (ref 0.0–3.0)
Eosinophils Absolute: 0.1 10*3/uL (ref 0.0–0.7)
Eosinophils Relative: 2 % (ref 0.0–5.0)
HEMATOCRIT: 39.1 % (ref 36.0–46.0)
HEMOGLOBIN: 13.1 g/dL (ref 12.0–15.0)
LYMPHS ABS: 1.7 10*3/uL (ref 0.7–4.0)
LYMPHS PCT: 25.4 % (ref 12.0–46.0)
MCHC: 33.4 g/dL (ref 30.0–36.0)
MCV: 83.9 fl (ref 78.0–100.0)
MONOS PCT: 5.9 % (ref 3.0–12.0)
Monocytes Absolute: 0.4 10*3/uL (ref 0.1–1.0)
NEUTROS ABS: 4.4 10*3/uL (ref 1.4–7.7)
Neutrophils Relative %: 66.4 % (ref 43.0–77.0)
Platelets: 300 10*3/uL (ref 150.0–400.0)
RBC: 4.65 Mil/uL (ref 3.87–5.11)
RDW: 13.3 % (ref 11.5–15.5)
WBC: 6.6 10*3/uL (ref 4.0–10.5)

## 2014-06-07 LAB — COMPREHENSIVE METABOLIC PANEL
ALT: 19 U/L (ref 0–35)
AST: 20 U/L (ref 0–37)
Albumin: 4 g/dL (ref 3.5–5.2)
Alkaline Phosphatase: 68 U/L (ref 39–117)
BILIRUBIN TOTAL: 0.4 mg/dL (ref 0.2–1.2)
BUN: 16 mg/dL (ref 6–23)
CALCIUM: 10.2 mg/dL (ref 8.4–10.5)
CO2: 26 meq/L (ref 19–32)
CREATININE: 0.7 mg/dL (ref 0.4–1.2)
Chloride: 106 mEq/L (ref 96–112)
GFR: 87.52 mL/min (ref >60.00)
Glucose, Bld: 92 mg/dL (ref 70–99)
Potassium: 4.2 mEq/L (ref 3.5–5.1)
Sodium: 140 mEq/L (ref 135–145)
Total Protein: 7.8 g/dL (ref 6.0–8.3)

## 2014-06-07 LAB — HEPATITIS B SURFACE ANTIGEN: Hepatitis B Surface Ag: NEGATIVE

## 2014-06-07 LAB — HEPATITIS B SURFACE ANTIBODY,QUALITATIVE: Hep B S Ab: NEGATIVE

## 2014-06-07 LAB — HEPATITIS A ANTIBODY, TOTAL: Hep A Total Ab: NONREACTIVE

## 2014-06-07 MED ORDER — ADALIMUMAB 40 MG/0.8ML ~~LOC~~ AJKT: 160.0000 mg | AUTO-INJECTOR | SUBCUTANEOUS | Status: DC

## 2014-06-07 MED ORDER — ADALIMUMAB 40 MG/0.8ML ~~LOC~~ AJKT: 1.0000 | AUTO-INJECTOR | SUBCUTANEOUS | Status: DC

## 2014-06-07 MED ORDER — PNEUMOCOCCAL VAC POLYVALENT 25 MCG/0.5ML IJ INJ: 0.5000 mL | INJECTION | INTRAMUSCULAR | Status: DC

## 2014-06-07 MED ORDER — ADALIMUMAB 40 MG/0.8ML ~~LOC~~ AJKT: 40.0000 mg | AUTO-INJECTOR | SUBCUTANEOUS | Status: DC

## 2014-06-07 NOTE — Progress Notes (Signed)
HISTORY OF PRESENT ILLNESS:  [name] [name] is a 58 y.o. 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 The patient establish with myself in March of 2009. She had been on 6-mercaptopurine. However, when the dose was escalated to gain therapeutic value, hepatotoxicity develop. Colonoscopy in April of 2010 was normal except for scarring in the transverse colon. Her last office evaluation appears to have been July 2010. We discussed other treatment options as well as followup plans. She was noncompliant. Colonoscopy in March of 2012 revealed active disease. She was to followup, but did not. She was seen most recently 04/06/2014 for surveillance colonoscopy. She was noted to have active Crohn's disease with high-grade stricture of the left colon. The exam was incomplete. She does presents today for followup as recommended. She has been on no medical therapy. Patient does report a 6 month history of left-sided abdominal soreness with bloating and occasional early satiety with nausea. No vomiting. She denies diarrhea but has had constipation. No bleeding. Her weight is stable.  REVIEW OF SYSTEMS:  All non-GI ROS negative upon review  Past Medical History  Diagnosis Date  . Crohn disease   . Contact lens/glasses fitting     wears contacts or glasses  . Breast mass, right 08/24/2013    Excised 11/10/13. 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 Diverticulosis   . Abnormal LFTs     Past Surgical History  Procedure Laterality Date  . Adnoids removed    . Abdominal hysterectomy  2003    lt SO-rt ovarian cysy  . Breast lumpectomy with needle localization Right 11/10/2013    Procedure: RIGHT BREAST MASS NEEDLE LOCALIZATION;  Surgeon: [name], MD;  Location: [facility];  Service: General;  Laterality: Right;  . Breast surgery      Social  History [name] [name]  reports that she quit smoking about 25 years ago. Her smoking use included Cigarettes. She smoked 0.00 packs per day. She has never used smokeless tobacco. She reports that she drinks about one ounce of alcohol per week. She reports that she does not use illicit drugs.  family history includes Cancer in her father and paternal aunt. There is no history of Colon cancer.  No Known Allergies     PHYSICAL EXAMINATION: Vital signs: BP 134/78  Pulse 78  Ht 5' 3"  (1.6 m)  Wt 190 lb (86.183 kg)  BMI 33.67 kg/m2 General: Well-developed, well-nourished, no acute distress HEENT: Sclerae are anicteric, conjunctiva pink. Oral mucosa intact Lungs: Clear Heart: Regular Abdomen: soft, mild left lower quadrant tenderness to palpation, nondistended, no obvious ascites, no peritoneal signs, normal bowel sounds. No organomegaly. Extremities: No edema Psychiatric: alert and oriented x3. Cooperative   ASSESSMENT:  #1. Active Crohn's colitis with colonic stricturing. Currently on no medical therapy. I discussed with her a great potential it she will need surgery at some point for complications of Crohn's (fistula, abscess, obstruction). I also discussed with her different therapeutic options to try to treat or manager Crohn's disease. We discussed that amino salicylates are ineffective and not appropriate for Crohn's, long-term prednisone is not appropriate, Entocort is a possibility though her disease seems a bit severe. Immunomodulators therapy has been unsuccessful in the past with subsequent hepatotoxicity with increasing dose. Finally, discussion of biologic agents. After complete a thorough discussion she is interested in going this route. I also discussed with  her the imperative nature of medical compliance and clinical followup and clinical communication as directed. We also discussed potential complications of biologic agents, namely infections and drug reactions.   PLAN:  #1.  CBC, comprehensive metabolic panel, hepatitis serologies, and TB test today #2. Contrast-enhanced CT scan of the abdomen and pelvis as baseline, particularly given significant unexamined portion of the colon #3. Pneumovax vaccination #4. Twinrix vaccination #5. Humira with standard induction dose followed by every 2 week maintenance dose. Literature provided on the drug. Specially nurse to oversee education and application and to assist the patient as needed #6. Routine office followup in 2 months. Patient has been explicitly instructed to contact the office for any questions or problems in the interim

## 2014-06-07 NOTE — Telephone Encounter (Signed)
Pt had spoken with the Humira nurse ambassador and she was told she needed to use a specialty pharmacy for the Humira. Explained to pt the process for sending information to [employer] and how they research her benefits and let us know what specialty pharmacy she will need to use. Let pt know we will be in touch with her as soon as we hear back from the company. Pt verbalized understanding.

## 2014-06-07 NOTE — Patient Instructions (Addendum)
Your physician has requested that you go to the basement for lab work before leaving today  You have been given a pneumovax vaccine and PPD skin test.  Please return to the office anytime Thursday, 08/10/2014 to have the ppd read.  Please follow up with Dr. Henrene Pastor on 08/25/2014 at 830 am  ____________________________________________________________________________________  You have been scheduled for a CT scan of the abdomen and pelvis at [facility] ([address]---this is in the same building as [facility]).   You are scheduled on 7/16/2015at 8:30am. You should arrive 15 minutes prior to your appointment time for registration. Please follow the written instructions below on the day of your exam:  WARNING: IF YOU ARE ALLERGIC TO IODINE/X-RAY DYE, PLEASE NOTIFY RADIOLOGY IMMEDIATELY AT [phone]! YOU WILL BE GIVEN A 13 HOUR PREMEDICATION PREP.  1) Do not eat or drink anything after 4:30am (4 hours prior to your test) 2) You have been given 2 bottles of oral contrast to drink. The solution may taste better if refrigerated, but do NOT add ice or any other liquid to this solution. Shake well before drinking.    Drink 1 bottle of contrast @ 6:30am (2 hours prior to your exam)  Drink 1 bottle of contrast @ 7:30am (1 hour prior to your exam)  You may take any medications as prescribed with a small amount of water except for the following: Metformin, Glucophage, Glucovance, Avandamet, Riomet, Fortamet, Actoplus Met, Janumet, Glumetza or Metaglip. The above medications must be held the day of the exam AND 48 hours after the exam.  The purpose of you drinking the oral contrast is to aid in the visualization of your intestinal tract. The contrast solution may cause some diarrhea. Before your exam is started, you will be given a small amount of fluid to drink. Depending on your individual set of symptoms, you may also receive an intravenous injection of x-ray contrast/dye. Plan on  being at [facility] for 30 minutes or long, depending on the type of exam you are having performed.  If you have any questions regarding your exam or if you need to reschedule, you may call the CT department at [phone] between the hours of 8:00 am and 5:00 pm, Monday-Friday.

## 2014-06-09 ENCOUNTER — Other Ambulatory Visit: Payer: Self-pay

## 2014-06-09 ENCOUNTER — Ambulatory Visit (INDEPENDENT_AMBULATORY_CARE_PROVIDER_SITE_OTHER)
Admission: RE | Admit: 2014-06-09 | Discharge: 2014-06-09 | Disposition: A | Discharge status: Home / Self Care | Payer: BC Managed Care – PPO | Source: Ambulatory Visit | Attending: Internal Medicine | Admitting: Internal Medicine

## 2014-06-09 DIAGNOSIS — K50119 Crohn's disease of large intestine with unspecified complications: Secondary | ICD-10-CM

## 2014-06-09 DIAGNOSIS — K5010 Crohn's disease of large intestine without complications: Secondary | ICD-10-CM

## 2014-06-09 DIAGNOSIS — K50918 Crohn's disease, unspecified, with other complication: Secondary | ICD-10-CM

## 2014-06-09 LAB — TB SKIN TEST
INDURATION: 0 mm
TB Skin Test: NEGATIVE

## 2014-06-09 MED ORDER — IOHEXOL 300 MG/ML  SOLN
100.0000 mL | Freq: Once | INTRAMUSCULAR | Status: AC | PRN
  Administered 2014-06-09: 100 mL via INTRAVENOUS

## 2014-06-10 ENCOUNTER — Ambulatory Visit (INDEPENDENT_AMBULATORY_CARE_PROVIDER_SITE_OTHER): Payer: BC Managed Care – PPO | Admitting: Internal Medicine

## 2014-06-10 DIAGNOSIS — K50918 Crohn's disease, unspecified, with other complication: Secondary | ICD-10-CM

## 2014-06-10 DIAGNOSIS — Z23 Encounter for immunization: Secondary | ICD-10-CM

## 2014-06-10 DIAGNOSIS — K5090 Crohn's disease, unspecified, without complications: Secondary | ICD-10-CM

## 2014-06-14 ENCOUNTER — Telehealth: Payer: Self-pay | Admitting: Internal Medicine

## 2014-06-14 NOTE — Telephone Encounter (Signed)
Pt called and would like to get her maintenance dose of Humira for a 90 days supply. [place] and changed order for pt to receive a 90 days supply. Pt aware.

## 2014-07-07 ENCOUNTER — Telehealth: Payer: Self-pay | Admitting: Internal Medicine

## 2014-07-07 NOTE — Telephone Encounter (Signed)
Pt called to see if she could come today for her twinrix vaccine. Discussed with pt that her insurance company would not cover it early. Pt states she will come next week for the vaccine.

## 2014-07-12 ENCOUNTER — Ambulatory Visit (INDEPENDENT_AMBULATORY_CARE_PROVIDER_SITE_OTHER): Payer: BC Managed Care – PPO | Admitting: Internal Medicine

## 2014-07-12 DIAGNOSIS — K5090 Crohn's disease, unspecified, without complications: Secondary | ICD-10-CM

## 2014-07-12 DIAGNOSIS — Z23 Encounter for immunization: Secondary | ICD-10-CM

## 2014-07-12 DIAGNOSIS — K50918 Crohn's disease, unspecified, with other complication: Secondary | ICD-10-CM

## 2014-07-15 ENCOUNTER — Encounter: Payer: Self-pay | Admitting: Internal Medicine

## 2014-07-15 ENCOUNTER — Encounter: Payer: Self-pay | Admitting: Gastroenterology

## 2014-07-22 ENCOUNTER — Telehealth: Payer: Self-pay | Admitting: Internal Medicine

## 2014-07-22 NOTE — Telephone Encounter (Signed)
New Humira script sent to [facility] for 90 days supply for pt. Spoke with pharmacy over the phone and called in 90days supply to [facility] at [phone].

## 2014-07-25 ENCOUNTER — Telehealth: Payer: Self-pay | Admitting: Internal Medicine

## 2014-07-25 NOTE — Telephone Encounter (Signed)
Spoke with pt and let her know that a verbal script was called into [facility] Friday. Pt states she will call [facility].

## 2014-08-08 ENCOUNTER — Ambulatory Visit: Payer: BC Managed Care – PPO | Admitting: Internal Medicine

## 2014-08-25 ENCOUNTER — Encounter: Payer: Self-pay | Admitting: Internal Medicine

## 2014-08-25 ENCOUNTER — Ambulatory Visit (INDEPENDENT_AMBULATORY_CARE_PROVIDER_SITE_OTHER): Payer: BC Managed Care – PPO | Admitting: Internal Medicine

## 2014-08-25 VITALS — BP 124/78 | HR 70 | Ht 63.0 in | Wt 194.2 lb

## 2014-08-25 DIAGNOSIS — K50112 Crohn's disease of large intestine with intestinal obstruction: Secondary | ICD-10-CM

## 2014-08-25 NOTE — Patient Instructions (Signed)
Please follow up with Dr. Henrene Pastor in 3 months

## 2014-08-25 NOTE — Progress Notes (Signed)
HISTORY OF PRESENT ILLNESS:  [name] [name] is a 58 y.o. female with long-standing Crohn's colitis. Management of her Crohn's disease has been sporadic due to patient related factors as previously documented. Immune modulator therapy was not effective and limited by hepatotoxicity. She was having problems with intermittent left-sided abdominal pain. Her last colonoscopy was performed may 13th 2015. She was found to have active Crohn's disease with high-grade stricture of the left colon. The examination could not be completed beyond this region. CT scan of the abdomen and pelvis 06/09/2014 revealed a 6 cm segment of the distal transverse colon which was thickened and associated with adjacent mesenteric and omental edema. No other abnormalities. That same month, she was started on Humira therapy. She has completed the induction phase and is now on maintenance therapy. Patient is please report that she feels well. She has had resolution of the left-sided abdominal pain. No additional symptoms such as nausea or abnormal bowel habits. Her only concern is the availability of her medication. She states that [employer] will only provide one month of medication at a time. 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 The patient has had her pneumococcal vaccine and is receiving Twinrix series. TB testing was negative.  REVIEW OF SYSTEMS:  All non-GI ROS negative on extensive review of all systems  Past Medical History  Diagnosis Date  . Crohn disease   . Contact lens/glasses fitting     wears contacts or glasses  . Breast mass, right 08/24/2013    Excised 11/10/13. 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 Diverticulosis   . Abnormal LFTs     Past Surgical History  Procedure Laterality Date  . Adnoids removed    . Abdominal hysterectomy  2003    lt SO-rt ovarian cysy  . Breast lumpectomy with  needle localization Right 11/10/2013    Procedure: RIGHT BREAST MASS NEEDLE LOCALIZATION;  Surgeon: [name], MD;  Location: [facility];  Service: General;  Laterality: Right;  . Breast surgery      Social History [name] [name]  reports that she quit smoking about 26 years ago. Her smoking use included Cigarettes. She smoked 0.00 packs per day. She has never used smokeless tobacco. She reports that she drinks about one ounce of alcohol per week. She reports that she does not use illicit drugs.  family history includes Breast cancer in her paternal aunt; Lung cancer in her father. There is no history of Colon cancer.  No Known Allergies .    PHYSICAL EXAMINATION: Vital signs: BP 124/78  Pulse 70  Ht 5' 3"  (1.6 m)  Wt 194 lb 3.2 oz (88.089 kg)  BMI 34.41 kg/m2 General: Well-developed, well-nourished, no acute distress HEENT: Sclerae are anicteric, conjunctiva pink. Oral mucosa intact Lungs: Clear Heart: Regular Abdomen: soft, nontender, nondistended, no obvious ascites, no peritoneal signs, normal bowel sounds. No organomegaly. Extremities: No edema Psychiatric: alert and oriented x3. Cooperative   ASSESSMENT:  #1. Crohn's colitis. Most recent colonoscopy with obstruction due to active disease. CT scan localized problem to 6 cm segment of distal transverse colon. Feeling much better on Humira therapy   PLAN:  #1. Continue Humira #2. Encouraged to discuss with her insurance company receiving more than 1 month supply medication so that she does not miss her scheduled dosage while she travels extensively around the country, do to her work.  Interruption of therapy could render treatment ineffective and result in clinical relapse #3. Routine office followup in 3 months. Contact the office in the interim for any questions or problems #4. Discussed relook colonoscopy in about 6 months to assess for mucosal healing with biologic agent.

## 2014-11-15 ENCOUNTER — Ambulatory Visit (INDEPENDENT_AMBULATORY_CARE_PROVIDER_SITE_OTHER): Payer: BC Managed Care – PPO | Admitting: Internal Medicine

## 2014-11-15 ENCOUNTER — Encounter: Payer: Self-pay | Admitting: Internal Medicine

## 2014-11-15 ENCOUNTER — Other Ambulatory Visit (INDEPENDENT_AMBULATORY_CARE_PROVIDER_SITE_OTHER): Payer: BC Managed Care – PPO

## 2014-11-15 VITALS — BP 130/70 | HR 96 | Ht 63.0 in | Wt 202.0 lb

## 2014-11-15 DIAGNOSIS — K5090 Crohn's disease, unspecified, without complications: Secondary | ICD-10-CM

## 2014-11-15 DIAGNOSIS — R1084 Generalized abdominal pain: Secondary | ICD-10-CM

## 2014-11-15 LAB — COMPREHENSIVE METABOLIC PANEL
ALT: 24 U/L (ref 0–35)
AST: 25 U/L (ref 0–37)
Albumin: 4.2 g/dL (ref 3.5–5.2)
Alkaline Phosphatase: 61 U/L (ref 39–117)
BUN: 17 mg/dL (ref 6–23)
CO2: 27 meq/L (ref 19–32)
Calcium: 9.4 mg/dL (ref 8.4–10.5)
Chloride: 107 mEq/L (ref 96–112)
Creatinine, Ser: 1 mg/dL (ref 0.4–1.2)
GFR: 64.48 mL/min (ref >60.00)
Glucose, Bld: 105 mg/dL — ABNORMAL HIGH (ref 70–99)
Potassium: 3.8 mEq/L (ref 3.5–5.1)
SODIUM: 140 meq/L (ref 135–145)
TOTAL PROTEIN: 7.4 g/dL (ref 6.0–8.3)
Total Bilirubin: 0.5 mg/dL (ref 0.2–1.2)

## 2014-11-15 LAB — URINALYSIS, ROUTINE W REFLEX MICROSCOPIC
Bilirubin Urine: NEGATIVE
Leukocytes, UA: NEGATIVE
Nitrite: NEGATIVE
Total Protein, Urine: NEGATIVE
UROBILINOGEN UA: 0.2 (ref 0.0–1.0)
Urine Glucose: NEGATIVE
pH: 5.5 (ref 5.0–8.0)

## 2014-11-15 LAB — CBC WITH DIFFERENTIAL/PLATELET
Basophils Absolute: 0 10*3/uL (ref 0.0–0.1)
Basophils Relative: 0.3 % (ref 0.0–3.0)
EOS ABS: 0.2 10*3/uL (ref 0.0–0.7)
Eosinophils Relative: 2 % (ref 0.0–5.0)
HCT: 38.4 % (ref 36.0–46.0)
Hemoglobin: 13 g/dL (ref 12.0–15.0)
LYMPHS ABS: 2.3 10*3/uL (ref 0.7–4.0)
LYMPHS PCT: 29.9 % (ref 12.0–46.0)
MCHC: 33.8 g/dL (ref 30.0–36.0)
MCV: 86.7 fl (ref 78.0–100.0)
MONOS PCT: 7.2 % (ref 3.0–12.0)
Monocytes Absolute: 0.6 10*3/uL (ref 0.1–1.0)
Neutro Abs: 4.7 10*3/uL (ref 1.4–7.7)
Neutrophils Relative %: 60.6 % (ref 43.0–77.0)
PLATELETS: 288 10*3/uL (ref 150.0–400.0)
RBC: 4.43 Mil/uL (ref 3.87–5.11)
RDW: 12.6 % (ref 11.5–15.5)
WBC: 7.7 10*3/uL (ref 4.0–10.5)

## 2014-11-15 NOTE — Progress Notes (Signed)
HISTORY OF PRESENT ILLNESS:  [name] [name] is a 58 y.o. female with long-standing Crohn's colitis. Management of her Crohn's disease has been sporadic due to patient related factors as previously documented. Amino modulator therapy was ineffective and limited by hepatotoxicity. Colonoscopy in May 2015 revealed active Crohn's disease with high-grade stricture of the left colon. The exam was incomplete. CT scan of the abdomen and pelvis 06/09/2014 revealed a 6 cm segment in the distal transverse colon with thickening and adjacent inflammation. No other abnormalities. She was started on Humira after negative TB testing. She is also receiving appropriate vaccinations. She was last evaluated 08/25/2014. At that time was doing much better on Humira therapy. She has continued therapy without interruption. However, since her last visit she tells me that she has not been feeling as well. She reports intermittent abdominal pain which is moderately severe at times. As well, alternating bowel habits. She denies bleeding or fevers. She has had weight gain. She also complains of fatigue. She wonders about a UTI. She has had a history of microscopic hematuria with negative urologic evaluation.  REVIEW OF SYSTEMS:  All non-GI ROS negative except for fatigue, low back pain,  Past Medical History  Diagnosis Date  . Crohn disease   . Contact lens/glasses fitting     wears contacts or glasses  . Breast mass, right 08/24/2013    Excised 11/10/13. 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 Diverticulosis   . Abnormal LFTs     Past Surgical History  Procedure Laterality Date  . Adnoids removed    . Abdominal hysterectomy  2003    lt SO-rt ovarian cysy  . Breast lumpectomy with needle localization Right 11/10/2013    Procedure: RIGHT BREAST MASS NEEDLE LOCALIZATION;  Surgeon: [name], MD;  Location: [facility];  Service: General;  Laterality: Right;  . Breast surgery      Social  History [name] [name]  reports that she quit smoking about 26 years ago. Her smoking use included Cigarettes. She smoked 0.00 packs per day. She has never used smokeless tobacco. She reports that she drinks about 1.0 oz of alcohol per week. She reports that she does not use illicit drugs.  family history includes Breast cancer in her paternal aunt; Lung cancer in her father. There is no history of Colon cancer.  No Known Allergies     PHYSICAL EXAMINATION: Vital signs: BP 130/70 mmHg  Pulse 96  Ht 5' 3"  (1.6 m)  Wt 202 lb (91.627 kg)  BMI 35.79 kg/m2  Constitutional: generally well-appearing, no acute distress Psychiatric: alert and oriented x3, cooperative Eyes: extraocular movements intact, anicteric, conjunctiva pink Mouth: oral pharynx moist, no lesions Neck: supple no lymphadenopathy Cardiovascular: heart regular rate and rhythm, no murmur Lungs: clear to auscultation bilaterally Abdomen: soft, nontender, nondistended, no obvious ascites, no peritoneal signs, normal bowel sounds, no organomegaly Rectal: Omitted Extremities: no lower extremity edema bilaterally Skin: no lesions on visible extremities Neuro: No focal deficits.   ASSESSMENT:   #1. Crohn's colitis. On Humira 5 months. Now with intermittent abdominal complaints as described   PLAN:  #1. CBC and comprehensive metabolic panel today #2. Urinalysis today #3. Contrast-enhanced CT scan of the abdomen and pelvis to evaluate abdominal complaints and compared to previous scan in July #4. Continue Humira every 2 weeks for now #5. Routine follow-up to be determined after the above completed

## 2014-11-15 NOTE — Patient Instructions (Signed)
Your physician has requested that you go to the basement for the following lab work before leaving today:  CMET, CBC  You have been scheduled for a CT scan of the abdomen and pelvis at [facility] ([address]---this is in the same building as [facility]).   You are scheduled on 12/23/2015at 9:30am. You should arrive 15 minutes prior to your appointment time for registration. Please follow the written instructions below on the day of your exam:  WARNING: IF YOU ARE ALLERGIC TO IODINE/X-RAY DYE, PLEASE NOTIFY RADIOLOGY IMMEDIATELY AT [phone]! YOU WILL BE GIVEN A 13 HOUR PREMEDICATION PREP.  1) Do not eat or drink anything after 5:30am (4 hours prior to your test) 2) You have been given 2 bottles of oral contrast to drink. The solution may taste better if refrigerated, but do NOT add ice or any other liquid to this solution. Shake well before drinking.    Drink 1 bottle of contrast @ 7:30am (2 hours prior to your exam)  Drink 1 bottle of contrast @ 8:30am (1 hour prior to your exam)  You may take any medications as prescribed with a small amount of water except for the following: Metformin, Glucophage, Glucovance, Avandamet, Riomet, Fortamet, Actoplus Met, Janumet, Glumetza or Metaglip. The above medications must be held the day of the exam AND 48 hours after the exam.  The purpose of you drinking the oral contrast is to aid in the visualization of your intestinal tract. The contrast solution may cause some diarrhea. Before your exam is started, you will be given a small amount of fluid to drink. Depending on your individual set of symptoms, you may also receive an intravenous injection of x-ray contrast/dye. Plan on being at [facility] for 30 minutes or long, depending on the type of exam you are having performed.  If you have any questions regarding your exam or if you need to reschedule, you may call the CT department at [phone] between the hours of 8:00  am and 5:00 pm, Monday-Friday.  ________________________________________________________________________

## 2014-11-16 ENCOUNTER — Ambulatory Visit (INDEPENDENT_AMBULATORY_CARE_PROVIDER_SITE_OTHER)
Admission: RE | Admit: 2014-11-16 | Discharge: 2014-11-16 | Disposition: A | Discharge status: Home / Self Care | Payer: BC Managed Care – PPO | Source: Ambulatory Visit | Attending: Internal Medicine | Admitting: Internal Medicine

## 2014-11-16 DIAGNOSIS — R1084 Generalized abdominal pain: Secondary | ICD-10-CM

## 2014-11-16 DIAGNOSIS — K5090 Crohn's disease, unspecified, without complications: Secondary | ICD-10-CM

## 2014-11-16 MED ORDER — IOHEXOL 300 MG/ML  SOLN
100.0000 mL | Freq: Once | INTRAMUSCULAR | Status: AC | PRN
  Administered 2014-11-16: 100 mL via INTRAVENOUS

## 2014-11-28 ENCOUNTER — Ambulatory Visit (AMBULATORY_SURGERY_CENTER): Payer: Self-pay | Admitting: *Deleted

## 2014-11-28 VITALS — Ht 62.0 in | Wt 206.2 lb

## 2014-11-28 DIAGNOSIS — K5090 Crohn's disease, unspecified, without complications: Secondary | ICD-10-CM

## 2014-11-28 MED ORDER — MOVIPREP 100 G PO SOLR: 1.0000 | Freq: Once | ORAL | Status: DC

## 2014-11-29 ENCOUNTER — Other Ambulatory Visit: Payer: Self-pay | Admitting: Internal Medicine

## 2014-11-29 NOTE — Telephone Encounter (Signed)
[employer] and received auth for Humira through 11/30/2015. Case #[id number]. Pt needs a PCP for new insurance. Suggested pt go to urgent care on [address] to get set up with PCP there.

## 2014-11-30 ENCOUNTER — Ambulatory Visit (INDEPENDENT_AMBULATORY_CARE_PROVIDER_SITE_OTHER): Payer: 59 | Admitting: Family Medicine

## 2014-11-30 VITALS — BP 122/78 | HR 71 | Temp 97.8°F | Resp 18 | Ht 63.0 in | Wt 201.4 lb

## 2014-11-30 DIAGNOSIS — K50919 Crohn's disease, unspecified, with unspecified complications: Secondary | ICD-10-CM

## 2014-11-30 DIAGNOSIS — Z23 Encounter for immunization: Secondary | ICD-10-CM

## 2014-11-30 NOTE — Patient Instructions (Signed)
Good to see you today!  We are glad to see you for a complete physical/ labs at your convenience.   You got your flu shot today

## 2014-11-30 NOTE — Progress Notes (Signed)
[facility] [address], [place] [address] [phone]  Date:  11/30/2014   Name:  [name] [name]   DOB:  [birth year]   MRN:  [id number]  PCP:  Linward Foster, MD    Chief Complaint: Establish Care and Referral   History of Present Illness:  [name] [name] is a 59 y.o. very pleasant female patient who presents with the following:  They need to establish care- recently moved back ot the area.  She has crohn disease- she needs areferral to se eDr. Henrene Pastor for her colon on Monday.  He is her ergular doctor and treats her  She is OW well today. 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 DIARRHEA 01/03/2011  . ABNORMAL TRANSAMINASE, (LFT'S) 06/07/2009  . 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 CROHN'S DISEASE 03/08/2009    Past Medical History  Diagnosis Date  . Crohn disease   . Contact lens/glasses fitting     wears contacts or glasses  . Breast mass, right 08/24/2013    Excised 11/10/13. 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 Diverticulosis   . Abnormal LFTs     Past Surgical History  Procedure Laterality Date  . Adnoids removed    . Abdominal hysterectomy  2003    lt SO-rt ovarian cysy  . Breast lumpectomy with needle localization Right 11/10/2013    Procedure: RIGHT BREAST MASS NEEDLE LOCALIZATION;  Surgeon: [name], MD;  Location: [facility];  Service: General;  Laterality: Right;  . Breast surgery      History  Substance Use Topics  . Smoking status: Former Smoker    Types: Cigarettes    Quit date: 08/24/1988  . Smokeless tobacco: Never Used  . Alcohol Use: 1.2 - 1.8 oz/week    2-3 Not specified per week    Family History  Problem Relation Age of Onset  . Lung cancer Father   . Cancer Father   . Breast cancer Paternal Aunt   . Colon cancer Neg Hx   .  Esophageal cancer Neg Hx   . Rectal cancer Neg Hx   . Stomach cancer Maternal Aunt   . Diabetes Mother   . Diabetes Brother   . Diabetes Maternal Grandfather   . Heart disease Maternal Grandfather   . Heart disease Paternal Grandfather     No Known Allergies  Medication list has been reviewed and updated.  Current Outpatient Prescriptions on File Prior to Visit  Medication Sig Dispense Refill  . Adalimumab (HUMIRA PEN) 40 MG/0.8ML PNKT Inject 40 mg into the skin every 14 (fourteen) days. After the completion of the starter kit 2 each 4  . calcium-vitamin D 250-100 MG-UNIT per tablet Take 1 tablet by mouth 2 (two) times daily.    . Coenzyme Q10 (CO Q-10 PO) Take 1 tablet by mouth daily.    Marland Kitchen MOVIPREP 100 G SOLR Take 1 kit (200 g total) by mouth once. 1 kit 0  . Multiple Vitamin (MULTIVITAMIN) tablet Take 1 tablet by mouth daily.    . niacin 100 MG tablet Take 100 mg by mouth daily with breakfast.    . vitamin B-12 (CYANOCOBALAMIN) 100 MCG tablet Take 50 mcg by mouth daily.     Current Facility-Administered Medications on File Prior to Visit  Medication Dose Route Frequency Provider Last Rate Last Dose  . pneumococcal 23 valent vaccine (PNU-IMMUNE) injection 0.5 mL  0.5 mL Intramuscular Tomorrow-1000 Irene Shipper, MD        Review of Systems:  As per HPI- otherwise negative.   Physical Examination: Filed Vitals:   11/30/14 1131  BP: 122/78  Pulse: 71  Temp: 97.8 F (36.6 C)  Resp: 18   Filed Vitals:   11/30/14 1131  Height: 5' 3"  (1.6 m)  Weight: 201 lb 6.4 oz (91.354 kg)   Body mass index is 35.69 kg/(m^2). Ideal Body Weight: Weight in (lb) to have BMI = 25: 140.8  GEN: WDWN, NAD, Non-toxic, A & O x 3, overweight, looks well HEENT: Atraumatic, Normocephalic. Neck supple. No masses, No LAD. Ears and Nose: No external deformity. CV: RRR, No M/G/R. No JVD. No thrill. No extra heart sounds. PULM: CTA B, no wheezes, crackles, rhonchi. No retractions. No resp.  distress. No accessory muscle use. EXTR: No c/c/e NEURO Normal gait.  PSYCH: Normally interactive. Conversant. Not depressed or anxious appearing.  Calm demeanor.    Assessment and Plan: Crohn disease, unspecified complication - Plan: Ambulatory referral to Gastroenterology  Immunization due - Plan: Flu Vaccine QUAD 36+ mos IM  Establish care.  Flu shot given and referred to see her GI doctor as required by her insurance She has been on humira for about one year now  Signed Lamar Blinks, MD

## 2014-12-05 ENCOUNTER — Encounter: Payer: Self-pay | Admitting: Internal Medicine

## 2014-12-05 ENCOUNTER — Ambulatory Visit (AMBULATORY_SURGERY_CENTER): Payer: 59 | Admitting: Internal Medicine

## 2014-12-05 VITALS — BP 136/75 | HR 64 | Temp 98.3°F | Resp 26 | Ht 62.0 in | Wt 206.0 lb

## 2014-12-05 DIAGNOSIS — K5669 Other intestinal obstruction: Secondary | ICD-10-CM

## 2014-12-05 DIAGNOSIS — K50919 Crohn's disease, unspecified, with unspecified complications: Secondary | ICD-10-CM

## 2014-12-05 DIAGNOSIS — R933 Abnormal findings on diagnostic imaging of other parts of digestive tract: Secondary | ICD-10-CM

## 2014-12-05 DIAGNOSIS — K529 Noninfective gastroenteritis and colitis, unspecified: Secondary | ICD-10-CM

## 2014-12-05 DIAGNOSIS — K56699 Other intestinal obstruction unspecified as to partial versus complete obstruction: Secondary | ICD-10-CM

## 2014-12-05 MED ORDER — SODIUM CHLORIDE 0.9 % IV SOLN: 500.0000 mL | INTRAVENOUS | Status: DC

## 2014-12-05 NOTE — Progress Notes (Signed)
Called to room to assist during endoscopic procedure.  Patient ID and intended procedure confirmed with present staff. Received instructions for my participation in the procedure from the performing physician.  

## 2014-12-05 NOTE — Progress Notes (Signed)
Patient was kept in RR a little longer due to the inability to pass a lot of gas.   She has had two doses of levsin and a rectal tube.  Her pain is decreasing, but we want to be sure she is okay.

## 2014-12-05 NOTE — Patient Instructions (Signed)
YOU HAD AN ENDOSCOPIC PROCEDURE TODAY AT [facility]: Refer to the procedure report that was given to you for any specific questions about what was found during the examination.  If the procedure report does not answer your questions, please call your gastroenterologist to clarify.  If you requested that your care partner not be given the details of your procedure findings, then the procedure report has been included in a sealed envelope for you to review at your convenience later.  YOU SHOULD EXPECT: Some feelings of bloating in the abdomen. Passage of more gas than usual.  Walking can help get rid of the air that was put into your GI tract during the procedure and reduce the bloating. If you had a lower endoscopy (such as a colonoscopy or flexible sigmoidoscopy) you may notice spotting of blood in your stool or on the toilet paper. If you underwent a bowel prep for your procedure, then you may not have a normal bowel movement for a few days.  DIET: Your first meal following the procedure should be a light meal and then it is ok to progress to your normal diet.  A half-sandwich or bowl of soup is an example of a good first meal.  Heavy or fried foods are harder to digest and may make you feel nauseous or bloated.  Likewise meals heavy in dairy and vegetables can cause extra gas to form and this can also increase the bloating.  Drink plenty of fluids but you should avoid alcoholic beverages for 24 hours.  ACTIVITY: Your care partner should take you home directly after the procedure.  You should plan to take it easy, moving slowly for the rest of the day.  You can resume normal activity the day after the procedure however you should NOT DRIVE or use heavy machinery for 24 hours (because of the sedation medicines used during the test).    SYMPTOMS TO REPORT IMMEDIATELY: A gastroenterologist can be reached at any hour.  During normal business hours, 8:30 AM to 5:00 PM Monday through Friday,  call [phone].  After hours and on weekends, please call the GI answering service at [phone] who will take a message and have the physician on call contact you.   Following lower endoscopy (colonoscopy or flexible sigmoidoscopy):  Excessive amounts of blood in the stool  Significant tenderness or worsening of abdominal pains  Swelling of the abdomen that is new, acute  Fever of 100F or higher  FOLLOW UP: If any biopsies were taken you will be contacted by phone or by letter within the next 1-3 weeks.  Call your gastroenterologist if you have not heard about the biopsies in 3 weeks.  Our staff will call the home number listed on your records the next business day following your procedure to check on you and address any questions or concerns that you may have at that time regarding the information given to you following your procedure. This is a courtesy call and so if there is no answer at the home number and we have not heard from you through the emergency physician on call, we will assume that you have returned to your regular daily activities without incident.  SIGNATURES/CONFIDENTIALITY: You and/or your care partner have signed paperwork which will be entered into your electronic medical record.  These signatures attest to the fact that that the information above on your After Visit Summary has been reviewed and is understood.  Full responsibility of the confidentiality of this  discharge information lies with you and/or your care-partner.  Read handout and see the surgeon as ordered.

## 2014-12-05 NOTE — Op Note (Signed)
[place]  [address] [place], [id number]   COLONOSCOPY PROCEDURE REPORT  PATIENT: [name], [name]  MR#: #[id number] BIRTHDATE: [birth year] , 31  yrs. old GENDER: female ENDOSCOPIST: Eustace Quail, MD REFERRED BY:.  Self / Office PROCEDURE DATE:  12/05/2014 PROCEDURE:   Colonoscopy with biopsy First Screening Colonoscopy - Avg.  risk and is 50 yrs.  old or older - No.  Prior Negative Screening - Now for repeat screening. N/A  History of Adenoma - Now for follow-up colonoscopy & has been > or = to 3 yrs.  N/A  Polyps Removed Today? No.  Polyps Removed Today? No.  Recommend repeat exam, <10 yrs? Polyps Removed Today? No.  Recommend repeat exam, <10 yrs? No. ASA CLASS:   Class II INDICATIONS:follow up for previously diagnosed colonic Crohn's disease. Last colonoscopy May 2015 with significant active Crohn's including colonic stricturing. Humira started shortly thereafter. Recent CT, to evaluate abdominal pain, shows improved acute inflammation but distal transverse colon stricture with evidence for obstruction. Colonoscopy now to reevaluate post Humira. MEDICATIONS: Monitored anesthesia care and Propofol 270 mg IV  DESCRIPTION OF PROCEDURE:   After the risks benefits and alternatives of the procedure were thoroughly explained, informed consent was obtained.  The digital rectal exam revealed no abnormalities of the rectum.   The LB [id number]  endoscope was introduced through the anus and advanced to the distal transverse colon. No adverse events experienced.   Limited by a stricture.   The quality of the prep was good, using MoviPrep  The instrument was then slowly withdrawn as the colon was fully examined.      COLON FINDINGS: The colonoscope was advanced to the distal transverse colon were a high-grade benign-appearing stricture (3-4 mm) was encountered.  The pediatric scope could not be advanced beyond this region.  Mild inflammation and  granulation type tissue was biopsied.  The remaining portions of the colon revealed no active Crohn's.  Scarring noted.  Mild diverticulosis present. Retroflexed views revealed no abnormalities. The time to cecum=  . Withdrawal time=  .  The scope was withdrawn and the procedure completed.  COMPLICATIONS: There were no immediate complications.  ENDOSCOPIC IMPRESSION: 1. 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 Acute Crohn's inflammation resolved on Humira 3. Incidental diverticulosis  RECOMMENDATIONS: 1.  Await biopsy results 2.  Surgical consultation with Dr. Excell Seltzer "Crohn's disease with symptomatic colonic stricture". I suspect surgery is inevitable. Best that she be evaluated before developing acute symptoms.  eSigned:  Eustace Quail, MD 12/05/2014 10:55 AM   cc: Jacqulyn Cane, MD, Excell Seltzer, MD, and The Patient

## 2014-12-05 NOTE — Progress Notes (Signed)
A/ox3 pleased with MAC, report to Suzanne RN 

## 2014-12-06 ENCOUNTER — Telehealth: Payer: Self-pay

## 2014-12-06 NOTE — Telephone Encounter (Signed)
Pt scheduled to see Dr. Excell Seltzer 12/21/14@9 :15am, pt to arrive there at 8:45am. Pt aware of appt.

## 2014-12-06 NOTE — Telephone Encounter (Signed)
  Follow up Call-  Call back number 12/05/2014 04/06/2014  Post procedure Call Back phone  # [phone]  Permission to leave phone message Yes Yes     Patient questions:  Do you have a fever, pain , or abdominal swelling? No. Pain Score  0 *  Have you tolerated food without any problems? Yes.    Have you been able to return to your normal activities? Yes.    Do you have any questions about your discharge instructions: Diet   No. Medications  No. Follow up visit  No.  Do you have questions or concerns about your Care? No.  Actions: * If pain score is 4 or above: No action needed, pain <4.

## 2014-12-09 ENCOUNTER — Encounter: Payer: Self-pay | Admitting: Internal Medicine

## 2014-12-12 ENCOUNTER — Ambulatory Visit (INDEPENDENT_AMBULATORY_CARE_PROVIDER_SITE_OTHER): Payer: 59 | Admitting: Internal Medicine

## 2014-12-12 DIAGNOSIS — K50918 Crohn's disease, unspecified, with other complication: Secondary | ICD-10-CM

## 2014-12-12 DIAGNOSIS — Z23 Encounter for immunization: Secondary | ICD-10-CM

## 2015-01-05 ENCOUNTER — Other Ambulatory Visit (INDEPENDENT_AMBULATORY_CARE_PROVIDER_SITE_OTHER): Payer: Self-pay | Admitting: General Surgery

## 2015-01-27 NOTE — Progress Notes (Signed)
Please put in obtain consent for surgery in Epic surgery 02-08-15 pre op 02-06-15 Thanks

## 2015-02-02 NOTE — Patient Instructions (Addendum)
BRETTANY SYDNEY  02/02/2015   Your procedure is scheduled on: Wednesday March 16th, 2016  Report to [facility] Main  Entrance and follow signs to               [place] at 630  AM.  Call this number if you have problems the morning of surgery [phone]   Remember: FOLLOW ALL DR [place]  Do not eat food :After Midnight tonight  Clear liquids all day tomorrow tueday 02-07-15, no clear liquids after midnight tomorrow night.     Take these medicines the morning of surgery with A SIP OF WATER: EYE DROP IF NEEDED                               You may not have any metal on your body including hair pins and              piercings  Do not wear jewelry, make-up, lotions, powders or perfumes.             Do not wear nail polish.  Do not shave  48 hours prior to surgery.              Men may shave face and neck.   Do not bring valuables to the hospital. [place].  Contacts, dentures or bridgework may not be worn into surgery.  Leave suitcase in the car. After surgery it may be brought to your room.     Patients discharged the day of surgery will not be allowed to drive home.  Name and phone number of your driver:  Special Instructions: N/A              Please read over the following fact sheets you were given: _____________________________________________________________________             [facility] - Preparing for Surgery Before surgery, you can play an important role.  Because skin is not sterile, your skin needs to be as free of germs as possible.  You can reduce the number of germs on your skin by washing with CHG (chlorahexidine gluconate) soap before surgery.  CHG is an antiseptic cleaner which kills germs and bonds with the skin to continue killing germs even after washing. Please DO NOT use if you have an allergy to CHG or antibacterial soaps.  If your skin becomes  reddened/irritated stop using the CHG and inform your nurse when you arrive at Short Stay. Do not shave (including legs and underarms) for at least 48 hours prior to the first CHG shower.  You may shave your face/neck. Please follow these instructions carefully:  1.  Shower with CHG Soap the night before surgery and the  morning of Surgery.  2.  If you choose to wash your hair, wash your hair first as usual with your  normal  shampoo.  3.  After you shampoo, rinse your hair and body thoroughly to remove the  shampoo.                           4.  Use CHG as you would any other liquid soap.  You can apply chg directly  to the  skin and wash                       Gently with a scrungie or clean washcloth.  5.  Apply the CHG Soap to your body ONLY FROM THE NECK DOWN.   Do not use on face/ open                           Wound or open sores. Avoid contact with eyes, ears mouth and genitals (private parts).                       Wash face,  Genitals (private parts) with your normal soap.             6.  Wash thoroughly, paying special attention to the area where your surgery  will be performed.  7.  Thoroughly rinse your body with warm water from the neck down.  8.  DO NOT shower/wash with your normal soap after using and rinsing off  the CHG Soap.                9.  Pat yourself dry with a clean towel.            10.  Wear clean pajamas.            11.  Place clean sheets on your bed the night of your first shower and do not  sleep with pets. Day of Surgery : Do not apply any lotions/deodorants the morning of surgery.  Please wear clean clothes to the hospital/surgery center.  FAILURE TO FOLLOW THESE INSTRUCTIONS MAY RESULT IN THE CANCELLATION OF YOUR SURGERY PATIENT SIGNATURE_________________________________  NURSE SIGNATURE__________________________________  ________________________________________________________________________    CLEAR LIQUID DIET   Foods Allowed                                                                      Foods Excluded  Coffee and tea, regular and decaf                             liquids that you cannot  Plain Jell-O in any flavor                                             see through such as: Fruit ices (not with fruit pulp)                                     milk, soups, orange juice  Iced Popsicles                                    All solid food Carbonated beverages, regular and diet  Cranberry, grape and apple juices Sports drinks like Gatorade Lightly seasoned clear broth or consume(fat free) Sugar, honey syrup  Sample Menu Breakfast                                Lunch                                     Supper Cranberry juice                    Beef broth                            Chicken broth Jell-O                                     Grape juice                           Apple juice Coffee or tea                        Jell-O                                      Popsicle                                                Coffee or tea                        Coffee or tea  _____________________________________________________________________

## 2015-02-06 ENCOUNTER — Encounter (HOSPITAL_COMMUNITY): Payer: Self-pay

## 2015-02-06 ENCOUNTER — Encounter (HOSPITAL_COMMUNITY)
Admission: RE | Admit: 2015-02-06 | Discharge: 2015-02-06 | Disposition: A | Discharge status: Home / Self Care | Payer: 59 | Source: Ambulatory Visit | Attending: General Surgery | Admitting: General Surgery

## 2015-02-06 ENCOUNTER — Other Ambulatory Visit (INDEPENDENT_AMBULATORY_CARE_PROVIDER_SITE_OTHER): Payer: Self-pay | Admitting: General Surgery

## 2015-02-06 DIAGNOSIS — K529 Noninfective gastroenteritis and colitis, unspecified: Secondary | ICD-10-CM

## 2015-02-06 DIAGNOSIS — Z01812 Encounter for preprocedural laboratory examination: Secondary | ICD-10-CM

## 2015-02-06 HISTORY — DX: Anemia, unspecified: D64.9

## 2015-02-06 LAB — CBC
HEMATOCRIT: 38.9 % (ref 36.0–46.0)
HEMOGLOBIN: 12.8 g/dL (ref 12.0–15.0)
MCH: 28.8 pg (ref 26.0–34.0)
MCHC: 32.9 g/dL (ref 30.0–36.0)
MCV: 87.4 fL (ref 78.0–100.0)
PLATELETS: 255 10*3/uL (ref 150–400)
RBC: 4.45 MIL/uL (ref 3.87–5.11)
RDW: 12.5 % (ref 11.5–15.5)
WBC: 6.6 10*3/uL (ref 4.0–10.5)

## 2015-02-06 LAB — COMPREHENSIVE METABOLIC PANEL
ALT: 24 U/L (ref 0–35)
AST: 28 U/L (ref 0–37)
Albumin: 4.2 g/dL (ref 3.5–5.2)
Alkaline Phosphatase: 66 U/L (ref 39–117)
Anion gap: 11 (ref 5–15)
BILIRUBIN TOTAL: 0.6 mg/dL (ref 0.3–1.2)
BUN: 15 mg/dL (ref 6–23)
CALCIUM: 9.4 mg/dL (ref 8.4–10.5)
CHLORIDE: 108 mmol/L (ref 96–112)
CO2: 21 mmol/L (ref 19–32)
Creatinine, Ser: 0.69 mg/dL (ref 0.50–1.10)
GLUCOSE: 114 mg/dL — AB (ref 70–99)
Potassium: 3.9 mmol/L (ref 3.5–5.1)
Sodium: 140 mmol/L (ref 135–145)
Total Protein: 7.4 g/dL (ref 6.0–8.3)

## 2015-02-06 LAB — ABO/RH: ABO/RH(D): O POS

## 2015-02-07 NOTE — H&P (Signed)
History of Present Illness [name] [name] T. Reichen Hutzler MD; 01/05/2015 10:00 AM) Patient words: eval for crohns.  The patient is a 59 year old female who presents with crohn's disease. She is referred by Dr. Henrene Pastor due to a colonic stricture secondary to Crohn's disease. The patient has been treated for Crohn's colitis for at least 20 years. For most of that time she has been maintained on medical therapy with only occasional flareups. She has never required GI surgery for Crohn's disease. She has had surgery for a fissure. About a year ago she developed a flareup and was started on Humira. At that time colonoscopy showed apparently fairly diffuse inflammatory change as well as an early stricture in the distal transverse colon. Her symptoms improved significantly on Humira. She however has continued to have episodic abdominal pain with upper abdominal loading and cramping associated with constipation. This is not constant. She's been feeling pretty well for the last 2 weeks after having some significant issues for a couple of weeks prior to that.  CT scan December 2016 personally reviewed which shows a significant appearing stricture in the distal transverse colon with dilated proximal colon consistent with partial obstruction. Recent repeat colonoscopy showed resolution of acute colitis but a significant stricture in the distal transverse colon estimated at about 3 mm. Other than her rectal fissure she has not really had any significant extra intestinal manifestations of her Crohn's disease and is tolerating the Humira well   Other Problems [name] Buffy, RN; 01/05/2015 9:22 AM) Crohn's Disease Other disease, cancer, significant illness  Past Surgical History [name] Buffy, RN; 01/05/2015 9:22 AM) Breast Biopsy Right. Breast Mass; Local Excision Right. Hysterectomy (not due to cancer) - Complete  Diagnostic Studies History [name] Buffy, RN; 01/05/2015 9:22 AM) Colonoscopy within  last year Mammogram within last year Pap Smear 1-5 years ago  Allergies [name] Buffy, RN; 01/05/2015 9:23 AM) No Known Drug Allergies02/09/2015  Medication History [name] Buffy, RN; 01/05/2015 9:24 AM) Julianne Rice Pen-Psoriasis Starter (40MG/0.8ML Pen-inj Kit, Subcutaneous as directed) Active. Calcium-Vitamin D (250-125MG-UNIT Tablet, Oral daily) Active. Co Q 10 (100MG Capsule, Oral daily) Active. Multivitamins (Oral daily) Active. Niacin ER (1000MG Tablet ER, Oral daily) Active. B12-Active (1MG Tablet Chewable, Oral daily) Active.  Social History [name] Buffy, RN; 01/05/2015 9:22 AM) Alcohol use Moderate alcohol use. Caffeine use Coffee, Tea. No drug use Tobacco use Former smoker.  Family History [name] Buffy, RN; 01/05/2015 9:22 AM) Breast Cancer Family Members In General. Cancer Father. Diabetes Mellitus Brother, Mother. Heart Disease Family Members In General. Respiratory Condition Father. Thyroid problems Mother.  Pregnancy / Birth History [name] Buffy, RN; 01/05/2015 9:22 AM) Age at menarche 55 years. Contraceptive History Oral contraceptives. Gravida 0 Para 0  Review of Systems Delilah Shan Moffitt RN; 01/05/2015 9:22 AM) General Not Present- Appetite Loss, Chills, Fatigue, Fever, Night Sweats, Weight Gain and Weight Loss. Skin Not Present- Change in Wart/Mole, Dryness, Hives, Jaundice, New Lesions, Non-Healing Wounds, Rash and Ulcer. HEENT Present- Wears glasses/contact lenses. Not Present- Earache, Hearing Loss, Hoarseness, Nose Bleed, Oral Ulcers, Ringing in the Ears, Seasonal Allergies, Sinus Pain, Sore Throat, Visual Disturbances and Yellow Eyes. Respiratory Present- Snoring. Not Present- Bloody sputum, Chronic Cough, Difficulty Breathing and Wheezing. Breast Not Present- Breast Mass, Breast Pain, Nipple Discharge and Skin Changes. Cardiovascular Not Present- Chest Pain, Difficulty Breathing Lying Down, Leg Cramps, Palpitations, Rapid  Heart Rate, Shortness of Breath and Swelling of Extremities. Gastrointestinal Present- Abdominal Pain, Bloating, Constipation and Excessive gas. Not Present- Bloody Stool, Change in Bowel Habits, Chronic diarrhea, Difficulty  Swallowing, Gets full quickly at meals, Hemorrhoids, Indigestion, Nausea, Rectal Pain and Vomiting. Female Genitourinary Not Present- Frequency, Nocturia, Painful Urination, Pelvic Pain and Urgency. Musculoskeletal Not Present- Back Pain, Joint Pain, Joint Stiffness, Muscle Pain, Muscle Weakness and Swelling of Extremities. Neurological Not Present- Decreased Memory, Fainting, Headaches, Numbness, Seizures, Tingling, Tremor, Trouble walking and Weakness. Psychiatric Not Present- Anxiety, Bipolar, Change in Sleep Pattern, Depression, Fearful and Frequent crying. Endocrine Not Present- Cold Intolerance, Excessive Hunger, Hair Changes, Heat Intolerance, Hot flashes and New Diabetes. Hematology Not Present- Easy Bruising, Excessive bleeding, Gland problems, HIV and Persistent Infections.   Vitals Delilah Shan Moffitt RN; 01/05/2015 9:23 AM) 01/05/2015 9:23 AM Weight: 202.2 lb Height: 63in Body Surface Area: 2.02 m Body Mass Index: 35.82 kg/m Temp.: 98.34F(Oral)  Pulse: 74 (Regular)  Resp.: 14 (Unlabored)  BP: 126/72 (Sitting, Left Arm, Standard)    Physical Exam [name] [name] T. [place] Dalporto MD; 01/05/2015 10:01 AM) The physical exam findings are as follows: Note:General: Alert, well-developed and mildly overweight, in no distress Skin: Warm and dry without rash or infection. HEENT: No palpable masses or thyromegaly. Sclera nonicteric. Pupils equal round and reactive. Oropharynx clear. Lymph nodes: No cervical, supraclavicular, or inguinal nodes palpable. Lungs: Breath sounds clear and equal. No wheezing or increased work of breathing. Cardiovascular: Regular rate and rhythm without murmer. No JVD or edema. Peripheral pulses intact. No carotid bruits. Abdomen:  Nondistended. Soft and nontender. No masses palpable. No organomegaly. No palpable hernias. Extremities: No edema or joint swelling or deformity. No chronic venous stasis changes. Neurologic: Alert and fully oriented. Gait normal. No focal weakness. Psychiatric: Normal mood and affect. Thought content appropriate with normal judgement and insight    Assessment & Plan [name] [name] T. Nevea Spiewak MD; 01/05/2015 10:04 AM) CROHN'S COLITIS, WITH INTESTINAL OBSTRUCTION (555.1  K50.112) Impression: Long-standing Crohn's colitis with flareup last year, now controlled on Humira but with symptomatic stricture in the distal transverse colon resulting in partial obstruction. I reviewed the situation in detail with the patient. I think it is likely that without surgery she would develop an emergency obstruction at some point with increased risk for surgery and need for colostomy. I would lean toward elective laparoscopic assisted resection. She is in agreement. We discussed the nature and indications for the surgery and recovery as well as risks of anesthetic complications, bleeding, infection, leakage and possible need for temporary colostomy, DVT. All her questions were answered and she was given literature regarding the procedure. She would like to schedule this in the near future. Current Plans  Schedule for Surgery Laparoscopic assisted partial colectomy with mechanical and antibiotic bowel prep at home. Plan to stop Humira 2 weeks prior to surgery.

## 2015-02-08 ENCOUNTER — Inpatient Hospital Stay (HOSPITAL_COMMUNITY): Payer: 59 | Admitting: Anesthesiology

## 2015-02-08 ENCOUNTER — Inpatient Hospital Stay (HOSPITAL_COMMUNITY)
Admission: RE | Admit: 2015-02-08 | Discharge: 2015-02-12 | DRG: 330 | Disposition: A | Discharge status: Home / Self Care | Payer: 59 | Source: Ambulatory Visit | Attending: General Surgery | Admitting: General Surgery

## 2015-02-08 ENCOUNTER — Encounter (HOSPITAL_COMMUNITY)
Admission: RE | Disposition: A | Discharge status: Home / Self Care | Payer: Self-pay | Source: Ambulatory Visit | Attending: General Surgery

## 2015-02-08 ENCOUNTER — Encounter (HOSPITAL_COMMUNITY): Payer: Self-pay | Admitting: Anesthesiology

## 2015-02-08 DIAGNOSIS — K5669 Other intestinal obstruction: Secondary | ICD-10-CM | POA: Diagnosis present

## 2015-02-08 DIAGNOSIS — Z6835 Body mass index (BMI) 35.0-35.9, adult: Secondary | ICD-10-CM

## 2015-02-08 DIAGNOSIS — Z79899 Other long term (current) drug therapy: Secondary | ICD-10-CM

## 2015-02-08 DIAGNOSIS — K50112 Crohn's disease of large intestine with intestinal obstruction: Secondary | ICD-10-CM | POA: Diagnosis present

## 2015-02-08 DIAGNOSIS — Z01812 Encounter for preprocedural laboratory examination: Secondary | ICD-10-CM

## 2015-02-08 DIAGNOSIS — Z803 Family history of malignant neoplasm of breast: Secondary | ICD-10-CM

## 2015-02-08 DIAGNOSIS — Z833 Family history of diabetes mellitus: Secondary | ICD-10-CM | POA: Diagnosis not present

## 2015-02-08 DIAGNOSIS — E663 Overweight: Secondary | ICD-10-CM | POA: Diagnosis present

## 2015-02-08 DIAGNOSIS — Z87891 Personal history of nicotine dependence: Secondary | ICD-10-CM | POA: Diagnosis not present

## 2015-02-08 DIAGNOSIS — K5010 Crohn's disease of large intestine without complications: Secondary | ICD-10-CM | POA: Diagnosis present

## 2015-02-08 HISTORY — PX: LAPAROSCOPIC PARTIAL COLECTOMY: SHX5907

## 2015-02-08 LAB — TYPE AND SCREEN
ABO/RH(D): O POS
Antibody Screen: NEGATIVE

## 2015-02-08 SURGERY — LAPAROSCOPIC PARTIAL COLECTOMY
Anesthesia: General | Site: Abdomen

## 2015-02-08 MED ORDER — ALVIMOPAN 12 MG PO CAPS
12.0000 mg | ORAL_CAPSULE | Freq: Two times a day (BID) | ORAL | Status: DC
  Administered 2015-02-09 – 2015-02-12 (×7): 12 mg via ORAL
  Filled 2015-02-08 (×8): qty 1

## 2015-02-08 MED ORDER — BUPIVACAINE-EPINEPHRINE 0.25% -1:200000 IJ SOLN
INTRAMUSCULAR | Status: AC
  Filled 2015-02-08: qty 1

## 2015-02-08 MED ORDER — PROPOFOL 10 MG/ML IV BOLUS
INTRAVENOUS | Status: AC
  Filled 2015-02-08: qty 20

## 2015-02-08 MED ORDER — KCL IN DEXTROSE-NACL 20-5-0.9 MEQ/L-%-% IV SOLN
INTRAVENOUS | Status: DC
  Administered 2015-02-08 – 2015-02-11 (×5): via INTRAVENOUS
  Filled 2015-02-08 (×8): qty 1000

## 2015-02-08 MED ORDER — HEPARIN SODIUM (PORCINE) 5000 UNIT/ML IJ SOLN
5000.0000 [IU] | Freq: Three times a day (TID) | INTRAMUSCULAR | Status: DC
  Administered 2015-02-08 – 2015-02-12 (×11): 5000 [IU] via SUBCUTANEOUS
  Filled 2015-02-08 (×14): qty 1

## 2015-02-08 MED ORDER — LACTATED RINGERS IR SOLN
Status: DC | PRN
  Administered 2015-02-08: 1000 mL

## 2015-02-08 MED ORDER — NEOSTIGMINE METHYLSULFATE 10 MG/10ML IV SOLN
INTRAVENOUS | Status: AC
  Filled 2015-02-08: qty 1

## 2015-02-08 MED ORDER — HYDROMORPHONE HCL 1 MG/ML IJ SOLN
INTRAMUSCULAR | Status: AC
  Filled 2015-02-08: qty 1

## 2015-02-08 MED ORDER — LIDOCAINE HCL (CARDIAC) 20 MG/ML IV SOLN
INTRAVENOUS | Status: DC | PRN
  Administered 2015-02-08: 100 mg via INTRAVENOUS

## 2015-02-08 MED ORDER — GLYCOPYRROLATE 0.2 MG/ML IJ SOLN
INTRAMUSCULAR | Status: AC
  Filled 2015-02-08: qty 3

## 2015-02-08 MED ORDER — CEFOTETAN DISODIUM-DEXTROSE 2-2.08 GM-% IV SOLR
INTRAVENOUS | Status: AC
  Filled 2015-02-08: qty 50

## 2015-02-08 MED ORDER — CISATRACURIUM BESYLATE 20 MG/10ML IV SOLN
INTRAVENOUS | Status: AC
  Filled 2015-02-08: qty 10

## 2015-02-08 MED ORDER — MEPERIDINE HCL 50 MG/ML IJ SOLN: 6.2500 mg | INTRAMUSCULAR | Status: DC | PRN

## 2015-02-08 MED ORDER — EPHEDRINE SULFATE 50 MG/ML IJ SOLN
INTRAMUSCULAR | Status: DC | PRN
  Administered 2015-02-08: 10 mg via INTRAVENOUS

## 2015-02-08 MED ORDER — 0.9 % SODIUM CHLORIDE (POUR BTL) OPTIME
TOPICAL | Status: DC | PRN
  Administered 2015-02-08: 2000 mL

## 2015-02-08 MED ORDER — INDOCYANINE GREEN 25 MG IV SOLR
INTRAVENOUS | Status: DC | PRN
  Administered 2015-02-08: 2.5 mg via INTRAVENOUS
  Administered 2015-02-08: 5 mg via INTRAVENOUS

## 2015-02-08 MED ORDER — SODIUM CHLORIDE 0.9 % IJ SOLN
INTRAMUSCULAR | Status: AC
  Filled 2015-02-08: qty 10

## 2015-02-08 MED ORDER — PROMETHAZINE HCL 25 MG/ML IJ SOLN
6.2500 mg | INTRAMUSCULAR | Status: DC | PRN
  Administered 2015-02-08: 6.25 mg via INTRAVENOUS

## 2015-02-08 MED ORDER — NEOSTIGMINE METHYLSULFATE 10 MG/10ML IV SOLN
INTRAVENOUS | Status: DC | PRN
  Administered 2015-02-08: 4 mg via INTRAVENOUS

## 2015-02-08 MED ORDER — SUFENTANIL CITRATE 50 MCG/ML IV SOLN
INTRAVENOUS | Status: AC
  Filled 2015-02-08: qty 1

## 2015-02-08 MED ORDER — HYDROMORPHONE HCL 1 MG/ML IJ SOLN
INTRAMUSCULAR | Status: DC | PRN
  Administered 2015-02-08 (×3): .4 mg via INTRAVENOUS

## 2015-02-08 MED ORDER — LACTATED RINGERS IV SOLN
INTRAVENOUS | Status: DC | PRN
  Administered 2015-02-08 (×3): via INTRAVENOUS

## 2015-02-08 MED ORDER — DEXTROSE 5 % IV SOLN
2.0000 g | INTRAVENOUS | Status: AC
  Administered 2015-02-08: 2 g via INTRAVENOUS
  Filled 2015-02-08: qty 2

## 2015-02-08 MED ORDER — BUPIVACAINE-EPINEPHRINE 0.25% -1:200000 IJ SOLN
INTRAMUSCULAR | Status: DC | PRN
  Administered 2015-02-08: 25 mL

## 2015-02-08 MED ORDER — ONDANSETRON HCL 4 MG PO TABS: 4.0000 mg | ORAL_TABLET | Freq: Four times a day (QID) | ORAL | Status: DC | PRN

## 2015-02-08 MED ORDER — HYDROMORPHONE HCL 2 MG/ML IJ SOLN
INTRAMUSCULAR | Status: AC
  Filled 2015-02-08: qty 1

## 2015-02-08 MED ORDER — CHLORHEXIDINE GLUCONATE 4 % EX LIQD: 60.0000 mL | Freq: Once | CUTANEOUS | Status: DC

## 2015-02-08 MED ORDER — ONDANSETRON HCL 4 MG/2ML IJ SOLN
INTRAMUSCULAR | Status: AC
  Filled 2015-02-08: qty 2

## 2015-02-08 MED ORDER — ONDANSETRON HCL 4 MG/2ML IJ SOLN
INTRAMUSCULAR | Status: DC | PRN
  Administered 2015-02-08: 4 mg via INTRAVENOUS

## 2015-02-08 MED ORDER — ALVIMOPAN 12 MG PO CAPS
12.0000 mg | ORAL_CAPSULE | Freq: Once | ORAL | Status: AC
  Administered 2015-02-08: 12 mg via ORAL
  Filled 2015-02-08: qty 1

## 2015-02-08 MED ORDER — MIDAZOLAM HCL 5 MG/5ML IJ SOLN
INTRAMUSCULAR | Status: DC | PRN
  Administered 2015-02-08: 2 mg via INTRAVENOUS

## 2015-02-08 MED ORDER — BUPIVACAINE LIPOSOME 1.3 % IJ SUSP
20.0000 mL | Freq: Once | INTRAMUSCULAR | Status: AC
  Administered 2015-02-08: 20 mL
  Filled 2015-02-08: qty 20

## 2015-02-08 MED ORDER — POLYVINYL ALCOHOL 1.4 % OP SOLN
1.0000 [drp] | Freq: Three times a day (TID) | OPHTHALMIC | Status: DC | PRN
Start: 2015-02-08 — End: 2015-02-12
  Filled 2015-02-08: qty 15

## 2015-02-08 MED ORDER — MIDAZOLAM HCL 2 MG/2ML IJ SOLN
INTRAMUSCULAR | Status: AC
  Filled 2015-02-08: qty 2

## 2015-02-08 MED ORDER — PROMETHAZINE HCL 25 MG/ML IJ SOLN
INTRAMUSCULAR | Status: AC
  Filled 2015-02-08: qty 1

## 2015-02-08 MED ORDER — DEXAMETHASONE SODIUM PHOSPHATE 10 MG/ML IJ SOLN
INTRAMUSCULAR | Status: AC
  Filled 2015-02-08: qty 1

## 2015-02-08 MED ORDER — MORPHINE SULFATE 2 MG/ML IJ SOLN
2.0000 mg | INTRAMUSCULAR | Status: DC | PRN
  Administered 2015-02-08 – 2015-02-09 (×4): 2 mg via INTRAVENOUS
  Filled 2015-02-08: qty 2
  Filled 2015-02-08 (×2): qty 1

## 2015-02-08 MED ORDER — SUFENTANIL CITRATE 50 MCG/ML IV SOLN
INTRAVENOUS | Status: DC | PRN
  Administered 2015-02-08 (×4): 10 ug via INTRAVENOUS
  Administered 2015-02-08: 5 ug via INTRAVENOUS
  Administered 2015-02-08: 20 ug via INTRAVENOUS
  Administered 2015-02-08: 10 ug via INTRAVENOUS

## 2015-02-08 MED ORDER — ONDANSETRON HCL 4 MG/2ML IJ SOLN: 4.0000 mg | Freq: Four times a day (QID) | INTRAMUSCULAR | Status: DC | PRN

## 2015-02-08 MED ORDER — OXYCODONE-ACETAMINOPHEN 5-325 MG PO TABS
1.0000 | ORAL_TABLET | ORAL | Status: DC | PRN
  Administered 2015-02-09 (×3): 1 via ORAL
  Administered 2015-02-10 (×2): 2 via ORAL
  Administered 2015-02-10 – 2015-02-11 (×3): 1 via ORAL
  Administered 2015-02-11 (×3): 2 via ORAL
  Administered 2015-02-11 – 2015-02-12 (×4): 1 via ORAL
  Filled 2015-02-08 (×2): qty 1
  Filled 2015-02-08: qty 2
  Filled 2015-02-08 (×2): qty 1
  Filled 2015-02-08: qty 2
  Filled 2015-02-08 (×2): qty 1
  Filled 2015-02-08 (×2): qty 2
  Filled 2015-02-08: qty 1
  Filled 2015-02-08 (×2): qty 2
  Filled 2015-02-08 (×2): qty 1

## 2015-02-08 MED ORDER — PROPOFOL 10 MG/ML IV BOLUS
INTRAVENOUS | Status: DC | PRN
  Administered 2015-02-08: 150 mg via INTRAVENOUS

## 2015-02-08 MED ORDER — LIDOCAINE HCL (CARDIAC) 20 MG/ML IV SOLN
INTRAVENOUS | Status: AC
  Filled 2015-02-08: qty 5

## 2015-02-08 MED ORDER — EPHEDRINE SULFATE 50 MG/ML IJ SOLN
INTRAMUSCULAR | Status: AC
  Filled 2015-02-08: qty 1

## 2015-02-08 MED ORDER — DEXAMETHASONE SODIUM PHOSPHATE 10 MG/ML IJ SOLN
INTRAMUSCULAR | Status: DC | PRN
  Administered 2015-02-08: 10 mg via INTRAVENOUS

## 2015-02-08 MED ORDER — CISATRACURIUM BESYLATE (PF) 10 MG/5ML IV SOLN
INTRAVENOUS | Status: DC | PRN
  Administered 2015-02-08 (×2): 2 mg via INTRAVENOUS
  Administered 2015-02-08: 6 mg via INTRAVENOUS
  Administered 2015-02-08: 10 mg via INTRAVENOUS
  Administered 2015-02-08: 4 mg via INTRAVENOUS

## 2015-02-08 MED ORDER — SUCCINYLCHOLINE CHLORIDE 20 MG/ML IJ SOLN
INTRAMUSCULAR | Status: DC | PRN
  Administered 2015-02-08: 100 mg via INTRAVENOUS

## 2015-02-08 MED ORDER — LACTATED RINGERS IV SOLN: INTRAVENOUS | Status: DC

## 2015-02-08 MED ORDER — GLYCOPYRROLATE 0.2 MG/ML IJ SOLN
INTRAMUSCULAR | Status: DC | PRN
  Administered 2015-02-08: 0.6 mg via INTRAVENOUS

## 2015-02-08 MED ORDER — HYDROMORPHONE HCL 1 MG/ML IJ SOLN
0.2500 mg | INTRAMUSCULAR | Status: DC | PRN
  Administered 2015-02-08: 0.5 mg via INTRAVENOUS

## 2015-02-08 SURGICAL SUPPLY — 69 items
APPLIER CLIP ROT 10 11.4 M/L (STAPLE)
BLADE EXTENDED COATED 6.5IN (ELECTRODE) IMPLANT
CABLE HIGH FREQUENCY MONO STRZ (ELECTRODE) ×2 IMPLANT
CELLS DAT CNTRL 66122 CELL SVR (MISCELLANEOUS) ×1 IMPLANT
CHLORAPREP W/TINT 26ML (MISCELLANEOUS) IMPLANT
CLIP APPLIE ROT 10 11.4 M/L (STAPLE) IMPLANT
DECANTER SPIKE VIAL GLASS SM (MISCELLANEOUS) ×2 IMPLANT
DRAIN CHANNEL 19F RND (DRAIN) IMPLANT
DRAPE LAPAROSCOPIC ABDOMINAL (DRAPES) ×2 IMPLANT
DRAPE SURG IRRIG POUCH 19X23 (DRAPES) ×2 IMPLANT
DRAPE UTILITY XL STRL (DRAPES) ×2 IMPLANT
DRSG OPSITE POSTOP 4X10 (GAUZE/BANDAGES/DRESSINGS) ×2 IMPLANT
DRSG OPSITE POSTOP 4X6 (GAUZE/BANDAGES/DRESSINGS) IMPLANT
DRSG OPSITE POSTOP 4X8 (GAUZE/BANDAGES/DRESSINGS) IMPLANT
ELECT PENCIL ROCKER SW 15FT (MISCELLANEOUS) ×4 IMPLANT
ELECT REM PT RETURN 9FT ADLT (ELECTROSURGICAL) ×2
ELECTRODE REM PT RTRN 9FT ADLT (ELECTROSURGICAL) ×1 IMPLANT
GAUZE SPONGE 4X4 12PLY STRL (GAUZE/BANDAGES/DRESSINGS) IMPLANT
GLOVE BIO SURGEON STRL SZ7.5 (GLOVE) ×4 IMPLANT
GLOVE BIOGEL PI IND STRL 7.0 (GLOVE) ×8 IMPLANT
GLOVE BIOGEL PI IND STRL 7.5 (GLOVE) ×2 IMPLANT
GLOVE BIOGEL PI INDICATOR 7.0 (GLOVE) ×8
GLOVE BIOGEL PI INDICATOR 7.5 (GLOVE) ×2
GLOVE ECLIPSE 7.5 STRL STRAW (GLOVE) ×4 IMPLANT
GLOVE INDICATOR 8.0 STRL GRN (GLOVE) ×4 IMPLANT
GOWN STRL REUS W/TWL XL LVL3 (GOWN DISPOSABLE) ×16 IMPLANT
KIT PROCEDURE DA VINCI SI (MISCELLANEOUS) ×1
KIT PROCEDURE DVNC SI (MISCELLANEOUS) ×1 IMPLANT
LEGGING LITHOTOMY PAIR STRL (DRAPES) IMPLANT
LIGASURE IMPACT 36 18CM CVD LR (INSTRUMENTS) ×2 IMPLANT
LIQUID BAND (GAUZE/BANDAGES/DRESSINGS) ×2 IMPLANT
PACK COLON (CUSTOM PROCEDURE TRAY) ×4 IMPLANT
PACK GENERAL/GYN (CUSTOM PROCEDURE TRAY) IMPLANT
PAD POSITIONING PINK XL (MISCELLANEOUS) ×2 IMPLANT
PORT LAP GEL ALEXIS MED 5-9CM (MISCELLANEOUS) IMPLANT
RELOAD BLUE CHELON 45 (STAPLE) IMPLANT
RELOAD PROXIMATE 75MM BLUE (ENDOMECHANICALS) ×2 IMPLANT
RTRCTR WOUND ALEXIS 18CM MED (MISCELLANEOUS) ×2
SCISSORS LAP 5X35 DISP (ENDOMECHANICALS) ×2 IMPLANT
SET IRRIG TUBING LAPAROSCOPIC (IRRIGATION / IRRIGATOR) ×4 IMPLANT
SHEARS HARMONIC ACE PLUS 36CM (ENDOMECHANICALS) IMPLANT
SLEEVE ADV FIXATION 5X100MM (TROCAR) ×4 IMPLANT
SOLUTION ANTI FOG 6CC (MISCELLANEOUS) IMPLANT
STAPLER PROXIMATE 75MM BLUE (STAPLE) ×2 IMPLANT
STAPLER VISISTAT 35W (STAPLE) ×2 IMPLANT
SUCTION POOLE TIP (SUCTIONS) ×2 IMPLANT
SUT CHROMIC 3 0 SH 27 (SUTURE) ×4 IMPLANT
SUT PDS AB 0 CT1 36 (SUTURE) IMPLANT
SUT PDS AB 1 CTX 36 (SUTURE) IMPLANT
SUT PDS AB 1 TP1 96 (SUTURE) IMPLANT
SUT PROLENE 2 0 KS (SUTURE) IMPLANT
SUT PROLENE 3 0 SH 48 (SUTURE) IMPLANT
SUT SILK 2 0 (SUTURE) ×1
SUT SILK 2 0 SH CR/8 (SUTURE) ×6 IMPLANT
SUT SILK 2-0 18XBRD TIE 12 (SUTURE) ×1 IMPLANT
SUT SILK 3 0 (SUTURE) ×1
SUT SILK 3 0 SH CR/8 (SUTURE) ×2 IMPLANT
SUT SILK 3-0 18XBRD TIE 12 (SUTURE) ×1 IMPLANT
SUT VICRYL 0 UR6 27IN ABS (SUTURE) ×2 IMPLANT
SYS LAPSCP GELPORT 120MM (MISCELLANEOUS)
SYSTEM LAPSCP GELPORT 120MM (MISCELLANEOUS) IMPLANT
TOWEL OR 17X26 10 PK STRL BLUE (TOWEL DISPOSABLE) ×4 IMPLANT
TOWEL OR NON WOVEN STRL DISP B (DISPOSABLE) ×2 IMPLANT
TRAY FOLEY CATH 14FRSI W/METER (CATHETERS) ×2 IMPLANT
TROCAR ADV FIXATION 12X100MM (TROCAR) IMPLANT
TROCAR ADV FIXATION 5X100MM (TROCAR) ×2 IMPLANT
TROCAR BLADELESS OPT 5 100 (ENDOMECHANICALS) IMPLANT
TROCAR XCEL BLUNT TIP 100MML (ENDOMECHANICALS) ×2 IMPLANT
TUBING FILTER THERMOFLATOR (ELECTROSURGICAL) IMPLANT

## 2015-02-08 NOTE — Anesthesia Postprocedure Evaluation (Signed)
  Anesthesia Post-op Note  Patient: [name] [name]  Procedure(s) Performed: Procedure(s) (LRB): LAPAROSCOPIC PARTIAL COLECTOMY (N/A)  Patient Location: PACU  Anesthesia Type: General  Level of Consciousness: awake and alert   Airway and Oxygen Therapy: Patient Spontanous Breathing  Post-op Pain: mild  Post-op Assessment: Post-op Vital signs reviewed, Patient's Cardiovascular Status Stable, Respiratory Function Stable, Patent Airway and No signs of Nausea or vomiting  Last Vitals:  Filed Vitals:   02/08/15 1330  BP: 127/77  Pulse: 79  Temp:   Resp: 15    Post-op Vital Signs: stable   Complications: No apparent anesthesia complications

## 2015-02-08 NOTE — Anesthesia Preprocedure Evaluation (Addendum)
Anesthesia Evaluation  Patient identified by MRN, date of birth, ID band Patient awake    Reviewed: Allergy & Precautions, H&P , NPO status , Patient's Chart, lab work & pertinent test results  History of Anesthesia Complications Negative for: history of anesthetic complications  Airway Mallampati: II  TM Distance: >3 FB Neck ROM: Full    Dental  (+) Teeth Intact, Dental Advisory Given   Pulmonary neg pulmonary ROS, former smoker,    Pulmonary exam normal       Cardiovascular negative cardio ROS      Neuro/Psych negative neurological ROS  negative psych ROS   GI/Hepatic negative GI ROS, Neg liver ROS,   Endo/Other  negative endocrine ROS  Renal/GU negative Renal ROS     Musculoskeletal   Abdominal   Peds  Hematology   Anesthesia Other Findings   Reproductive/Obstetrics negative OB ROS                            Anesthesia Physical  Anesthesia Plan  ASA: II  Anesthesia Plan: General   Post-op Pain Management:    Induction:   Airway Management Planned: Oral ETT  Additional Equipment:   Intra-op Plan:   Post-operative Plan: Extubation in OR  Informed Consent: I have reviewed the patients History and Physical, chart, labs and discussed the procedure including the risks, benefits and alternatives for the proposed anesthesia with the patient or authorized representative who has indicated his/her understanding and acceptance.   Dental advisory given  Plan Discussed with: Anesthesiologist, CRNA and Surgeon  Anesthesia Plan Comments:        Anesthesia Quick Evaluation

## 2015-02-08 NOTE — Op Note (Signed)
Preoperative Diagnosis: Crohn's colitis with stricture  Postoprative Diagnosis: Crohn's colitis with stricture  Procedure: Procedure(s): LAPAROSCOPIC PARTIAL COLECTOMY   Surgeon: Excell Seltzer T   Assistants: Autumn Messing  Anesthesia:  General endotracheal anesthesia  Indications: Patient is a 59 year old female with long-standing Crohn's colitis involving the distal transverse colon. On recent colonoscopy and then subsequent CT scan she has been found to have a very tight stricture of the distal transverse colon with partial obstruction. There is no evidence of significant colitis in the distal colon. After preoperative discussion regarding indications and risks detailed elsewhere we have elected to proceed with laparoscopic-assisted partial colectomy.    Procedure Detail: Following a mechanical and antibiotic bowel prep at home the patient is brought to the operating room, placed in the supine position on the operating table and general endotracheal anesthesia induced. PAS were in place. Foley catheter was placed. She was carefully positioned in padded; lithotomy position. The abdomen was widely sterilely prepped and draped. Preoperative antibiotics were given intravenously. Patient timeout was performed and correct procedure verified. Access was obtained through a 1-1/2 cm incision just above the umbilicus in the midline with an open Hassan technique. Mattress suture of 0 Vicryl and pneumoperitoneum established. Under direct vision 25 mm trochars were placed in the right lateral abdomen and a 5 mm trocar in the left lateral abdomen. The small bowel appeared normal. The proximal and mid transverse colon appeared normal and then there was obvious inflammatory change involving the distal transverse colon and omentum just proximal to the splenic flexure. The left colon appeared grossly normal. Mobilization was begun lifting the omentum and working on the more proximal transverse colon the omentum  was dissected superiorly off the colon working toward the lesser sac. This dissection progressed distally along the distal transverse colon where there was quite a bit of inflammatory change and friability and omental adhesions. This made the dissection somewhat tedious. However after dissecting through very thick areas of omentum and the lesser sac was entered and clearly identified. The left colon was mobilized beginning at the distal left colon dividing lateral peritoneal attachments and working proximally up toward the splenic flexure. The splenic flexure was mobilized. The lesser sac was then widely opened from the mid transverse colon working back toward the splenic flexure completely dissecting the omentum away and the splenic flexure completely mobilized and further blunt and harmonic scalpel dissection was used to mobilize completely the proximal left colon off the drug's fascia. There was a large inflammatory mass involving the distal transverse colon. At this point I felt that we had maximum mobilization and particularly with the thickness and friability of the mesentery elected to resect the mesentery and divide the bowel and do anastomosis extracorporeally. An approximately 5 cm extension was created superiorly on the midline incision and the abdomen open and wound protector placed. There was actually very good mobility of the proximal transverse colon and of the proximal left colon up to the incision. Initially the transverse colon was divided at approximately its midpoint and healthy-appearing bowel with the GIA stapler. The mesentery of the transverse colon was then sequentially divided with the LigaSure were working distally and the proximal mesentery was additionally suture ligated with 2-0 silk. Dissection continued distally bringing the specimen out through the incision working around the inflammatory mass delivering this through the incision and then continuing along the mesentery of the splenic  flexure down to the proximal left colon which came up easily into the incision. This was divided with the  GIA stapler and the specimen was removed. The specimen was oriented and sent for permanent pathology. We then used fluorescent imaging after injecting 1 mL of indocyanine green intravenously which showed very good blood supply to both ends of the divided colon. An end-to-end anastomosis was then created in 2 layers with an outer row of interrupted seromuscular 2-0 silk followed by a circumferential full thickness row of 3-0 chromic followed by an anterior row of seromuscular 2-0 silk. There was no tension and the anastomosis appeared widely patent. The bowel was then replaced into the abdomen and wound protector and all gloves gowns drapes and instruments changed under protocol. Following this the midline incision was closed with running #1 PDS beginning at either end of the incision and tied centrally. The fascia was infiltrated with Exparel local anesthetic. The abdomen was again reinsufflated and laparoscopy showed no bleeding or other problems.  We again used fluorescent imaging showing very good blood supply up to both sides of the anastomosis. There was no tension. The abdomen was irrigated. All CO2 then evacuated and trochars removed. Skin incisions were closed with staples. Sponge needle and instrument counts were correct.   Findings: As above  Estimated Blood Loss:  200 mL         Drains: none  Blood Given: none          Specimens: Distal transverse and proximal left colon        Complications:  * No complications entered in OR log *         Disposition: PACU - hemodynamically stable.         Condition: stable

## 2015-02-08 NOTE — Anesthesia Procedure Notes (Signed)
Procedure Name: Intubation Date/Time: 02/08/2015 8:39 AM Performed by: Danley Danker L Patient Re-evaluated:Patient Re-evaluated prior to inductionOxygen Delivery Method: Circle system utilized Preoxygenation: Pre-oxygenation with 100% oxygen Intubation Type: IV induction Ventilation: Mask ventilation without difficulty and Oral airway inserted - appropriate to patient size Laryngoscope Size: Sabra Heck and 2 Grade View: Grade I Tube type: Oral Tube size: 7.5 mm Number of attempts: 1 Airway Equipment and Method: Stylet Placement Confirmation: ETT inserted through vocal cords under direct vision,  breath sounds checked- equal and bilateral and positive ETCO2 Secured at: 20 cm Tube secured with: Tape Dental Injury: Teeth and Oropharynx as per pre-operative assessment

## 2015-02-08 NOTE — Interval H&P Note (Signed)
History and Physical Interval Note:  02/08/2015 8:26 AM  [name] [name]  has presented today for surgery, with the diagnosis of Crohn's colitis with stricture  The various methods of treatment have been discussed with the patient and family. After consideration of risks, benefits and other options for treatment, the patient has consented to  Procedure(s): LAPAROSCOPIC PARTIAL COLECTOMY (N/A) as a surgical intervention .  The patient's history has been reviewed, patient examined, no change in status, stable for surgery.  I have reviewed the patient's chart and labs.  Questions were answered to the patient's satisfaction.     Licet Dunphy T

## 2015-02-08 NOTE — Transfer of Care (Signed)
Immediate Anesthesia Transfer of Care Note  Patient: [name] [name]  Procedure(s) Performed: Procedure(s): LAPAROSCOPIC PARTIAL COLECTOMY (N/A)  Patient Location: PACU  Anesthesia Type:General  Level of Consciousness: awake and oriented  Airway & Oxygen Therapy: Patient Spontanous Breathing and Patient connected to face mask oxygen  Post-op Assessment: Report given to RN and Post -op Vital signs reviewed and stable  Post vital signs: Reviewed and stable  Last Vitals:  Filed Vitals:   02/08/15 0625  BP: 125/69  Pulse: 77  Temp: 36.6 C  Resp: 18    Complications: No apparent anesthesia complications

## 2015-02-09 ENCOUNTER — Encounter (HOSPITAL_COMMUNITY): Payer: Self-pay | Admitting: General Surgery

## 2015-02-09 LAB — CBC
HCT: 36.4 % (ref 36.0–46.0)
Hemoglobin: 11.9 g/dL — ABNORMAL LOW (ref 12.0–15.0)
MCH: 28.6 pg (ref 26.0–34.0)
MCHC: 32.7 g/dL (ref 30.0–36.0)
MCV: 87.5 fL (ref 78.0–100.0)
Platelets: 258 10*3/uL (ref 150–400)
RBC: 4.16 MIL/uL (ref 3.87–5.11)
RDW: 12.7 % (ref 11.5–15.5)
WBC: 11.2 10*3/uL — ABNORMAL HIGH (ref 4.0–10.5)

## 2015-02-09 LAB — BASIC METABOLIC PANEL
ANION GAP: 10 (ref 5–15)
BUN: 9 mg/dL (ref 6–23)
CALCIUM: 8.9 mg/dL (ref 8.4–10.5)
CO2: 25 mmol/L (ref 19–32)
CREATININE: 0.77 mg/dL (ref 0.50–1.10)
Chloride: 104 mmol/L (ref 96–112)
GFR calc Af Amer: >90 mL/min (ref >90)
GFR calc non Af Amer: >90 mL/min (ref >90)
Glucose, Bld: 134 mg/dL — ABNORMAL HIGH (ref 70–99)
Potassium: 4.2 mmol/L (ref 3.5–5.1)
Sodium: 139 mmol/L (ref 135–145)

## 2015-02-09 NOTE — Care Management Note (Signed)
    Page 1 of 1   02/09/2015     11:23:40 AM CARE MANAGEMENT NOTE 02/09/2015  Patient:  [name] [name], [name] [name]   Account Number:  [id number]  Date Initiated:  02/09/2015  Documentation initiated by:  Sunday Spillers  Subjective/Objective Assessment:   59 yo female admitted s/p colectomy. PTA lived at home with spouse.     Action/Plan:   Home when stable   Anticipated DC Date:  02/12/2015   Anticipated DC Plan:  [place]  CM consult      Choice offered to / List presented to:             Status of service:  Completed, signed off Medicare Important Message given?   (If response is "NO", the following Medicare IM given date fields will be blank) Date Medicare IM given:   Medicare IM given by:   Date Additional Medicare IM given:   Additional Medicare IM given by:    Discharge Disposition:  HOME/SELF CARE  Per UR Regulation:  Reviewed for med. necessity/level of care/duration of stay  If discussed at [place] of Stay Meetings, dates discussed:    Comments:

## 2015-02-09 NOTE — Progress Notes (Signed)
Patient ID: [name] [name], female   DOB: 30-Jun-1956, 59 y.o.   MRN: [id number] 1 Day Post-Op  Subjective: Was very drowsy yesterday but feels much better today. No nausea. Mild pain well controlled with medications.  Objective: Vital signs in last 24 hours: Temp:  [97.8 F (36.6 C)-99.1 F (37.3 C)] 98.2 F (36.8 C) (03/17 0623) Pulse Rate:  [72-97] 74 (03/17 0623) Resp:  [10-18] 18 (03/17 0623) BP: (122-150)/(70-83) 150/81 mmHg (03/17 0623) SpO2:  [92 %-99 %] 95 % (03/17 0623) Last BM Date: 02/07/15  Intake/Output from previous day: 03/16 0701 - 03/17 0700 In: 4233.3 [I.V.:4183.3; IV Piggyback:50] Out: 3350 [Urine:3200; Blood:150] Intake/Output this shift:    General appearance: alert, cooperative and no distress GI: normal findings: soft, non-tender Incision/Wound: no erythema or swelling or drainage  Lab Results:   Recent Labs  02/06/15 0845 02/09/15 0525  WBC 6.6 11.2*  HGB 12.8 11.9*  HCT 38.9 36.4  PLT 255 258   BMET  Recent Labs  02/06/15 0845 02/09/15 0525  NA 140 139  K 3.9 4.2  CL 108 104  CO2 21 25  GLUCOSE 114* 134*  BUN 15 9  CREATININE 0.69 0.77  CALCIUM 9.4 8.9     Studies/Results: No results found.  Anti-infectives: Anti-infectives    Start     Dose/Rate Route Frequency Ordered Stop   02/08/15 0625  cefoTEtan (CEFOTAN) 2 g in dextrose 5 % 50 mL IVPB     2 g 100 mL/hr over 30 Minutes Intravenous On call to O.R. 02/08/15 2072 02/08/15 0845      Assessment/Plan: s/p Procedure(s): LAPAROSCOPIC PARTIAL COLECTOMY Doing well postoperatively. Start clear liquid diet. Ambulation encouraged.   LOS: 1 day    [name] T 02/09/2015

## 2015-02-10 LAB — BASIC METABOLIC PANEL
Anion gap: 8 (ref 5–15)
BUN: 8 mg/dL (ref 6–23)
CO2: 26 mmol/L (ref 19–32)
Calcium: 8.9 mg/dL (ref 8.4–10.5)
Chloride: 107 mmol/L (ref 96–112)
Creatinine, Ser: 0.75 mg/dL (ref 0.50–1.10)
GFR calc Af Amer: >90 mL/min (ref >90)
GFR calc non Af Amer: >90 mL/min (ref >90)
Glucose, Bld: 100 mg/dL — ABNORMAL HIGH (ref 70–99)
Potassium: 4 mmol/L (ref 3.5–5.1)
Sodium: 141 mmol/L (ref 135–145)

## 2015-02-10 LAB — CBC
HEMATOCRIT: 32.6 % — AB (ref 36.0–46.0)
HEMOGLOBIN: 10.7 g/dL — AB (ref 12.0–15.0)
MCH: 29.3 pg (ref 26.0–34.0)
MCHC: 32.8 g/dL (ref 30.0–36.0)
MCV: 89.3 fL (ref 78.0–100.0)
Platelets: 226 10*3/uL (ref 150–400)
RBC: 3.65 MIL/uL — AB (ref 3.87–5.11)
RDW: 12.9 % (ref 11.5–15.5)
WBC: 8 10*3/uL (ref 4.0–10.5)

## 2015-02-10 NOTE — Progress Notes (Signed)
Patient ID: [name] [name], female   DOB: 08-21-1956, 59 y.o.   MRN: [id number] 2 Days Post-Op  Subjective: Feels "pretty good". Tolerating clear liquids without nausea. Minimal discomfort. No flatus or bowel movements yet.  Objective: Vital signs in last 24 hours: Temp:  [98 F (36.7 C)-98.2 F (36.8 C)] 98 F (36.7 C) (03/18 0534) Pulse Rate:  [63-78] 63 (03/18 0534) Resp:  [18] 18 (03/18 0534) BP: (115-153)/(64-80) 128/64 mmHg (03/18 0534) SpO2:  [93 %-97 %] 94 % (03/18 0534) Last BM Date: 02/07/15  Intake/Output from previous day: 03/17 0701 - 03/18 0700 In: 2837.5 [P.O.:1200; I.V.:1637.5] Out: 2900 [Urine:2900] Intake/Output this shift:    General appearance: alert, cooperative and no distress GI: minimal appropriate incisional tenderness Incision/Wound: no erythema or drainage  Lab Results:   Recent Labs  02/09/15 0525 02/10/15 0550  WBC 11.2* 8.0  HGB 11.9* 10.7*  HCT 36.4 32.6*  PLT 258 226   BMET  Recent Labs  02/09/15 0525 02/10/15 0550  NA 139 141  K 4.2 4.0  CL 104 107  CO2 25 26  GLUCOSE 134* 100*  BUN 9 8  CREATININE 0.77 0.75  CALCIUM 8.9 8.9     Studies/Results: No results found.  Anti-infectives: Anti-infectives    Start     Dose/Rate Route Frequency Ordered Stop   02/08/15 0625  cefoTEtan (CEFOTAN) 2 g in dextrose 5 % 50 mL IVPB     2 g 100 mL/hr over 30 Minutes Intravenous On call to O.R. 02/08/15 5929 02/08/15 0845      Assessment/Plan: s/p Procedure(s): LAPAROSCOPIC PARTIAL COLECTOMY Doing very well without apparent complication. Start full liquid diet. Ambulation encouraged.   LOS: 2 days    Romir Klimowicz T 02/10/2015

## 2015-02-11 LAB — CBC
HCT: 31.8 % — ABNORMAL LOW (ref 36.0–46.0)
Hemoglobin: 10.5 g/dL — ABNORMAL LOW (ref 12.0–15.0)
MCH: 29.3 pg (ref 26.0–34.0)
MCHC: 33 g/dL (ref 30.0–36.0)
MCV: 88.8 fL (ref 78.0–100.0)
PLATELETS: 217 10*3/uL (ref 150–400)
RBC: 3.58 MIL/uL — AB (ref 3.87–5.11)
RDW: 12.7 % (ref 11.5–15.5)
WBC: 7.3 10*3/uL (ref 4.0–10.5)

## 2015-02-11 MED ORDER — KCL IN DEXTROSE-NACL 20-5-0.9 MEQ/L-%-% IV SOLN
INTRAVENOUS | Status: DC
  Filled 2015-02-11 (×2): qty 1000

## 2015-02-11 NOTE — Progress Notes (Signed)
Patient ID: [name] [name], female   DOB: 03-25-1956, 59 y.o.   MRN: [id number] 3 Days Post-Op  Subjective: No complaints this morning. Tolerating full liquid diet without nausea or bloating. Has not had any flatus or bowel movements. Minimal discomfort.  Objective: Vital signs in last 24 hours: Temp:  [97.9 F (36.6 C)-99.2 F (37.3 C)] 97.9 F (36.6 C) (03/19 0609) Pulse Rate:  [65-80] 65 (03/19 0609) Resp:  [18-20] 18 (03/19 0609) BP: (114-132)/(68-79) 114/69 mmHg (03/19 0609) SpO2:  [94 %-98 %] 96 % (03/19 0609) Last BM Date: 02/07/15  Intake/Output from previous day: 03/18 0701 - 03/19 0700 In: 1860 [P.O.:960; I.V.:900] Out: 3200 [Urine:3200] Intake/Output this shift:    General appearance: alert, cooperative and no distress GI: normal findings: soft, non-tender Incision/Wound: no erythema or drainage  Lab Results:   Recent Labs  02/10/15 0550 02/11/15 0420  WBC 8.0 7.3  HGB 10.7* 10.5*  HCT 32.6* 31.8*  PLT 226 217   BMET  Recent Labs  02/09/15 0525 02/10/15 0550  NA 139 141  K 4.2 4.0  CL 104 107  CO2 25 26  GLUCOSE 134* 100*  BUN 9 8  CREATININE 0.77 0.75  CALCIUM 8.9 8.9     Studies/Results: No results found.  Anti-infectives: Anti-infectives    Start     Dose/Rate Route Frequency Ordered Stop   02/08/15 0625  cefoTEtan (CEFOTAN) 2 g in dextrose 5 % 50 mL IVPB     2 g 100 mL/hr over 30 Minutes Intravenous On call to O.R. 02/08/15 8638 02/08/15 0845      Assessment/Plan: s/p Procedure(s): LAPAROSCOPIC PARTIAL COLECTOMY Doing well without apparent complication. Awaiting bowel function. Advance to soft diet. Ambulation encouraged.   LOS: 3 days    Tanner Yeley T 02/11/2015

## 2015-02-12 MED ORDER — OXYCODONE-ACETAMINOPHEN 5-325 MG PO TABS: 1.0000 | ORAL_TABLET | ORAL | Status: DC | PRN

## 2015-02-12 NOTE — Discharge Instructions (Signed)
[facility]      [facility] [phone]  OPEN ABDOMINAL SURGERY: POST OP INSTRUCTIONS  Always review your discharge instruction sheet given to you by the facility where your surgery was performed.  IF YOU HAVE DISABILITY OR FAMILY LEAVE FORMS, YOU MUST BRING THEM TO THE OFFICE FOR PROCESSING.  PLEASE DO NOT GIVE THEM TO YOUR DOCTOR.  1. A prescription for pain medication may be given to you upon discharge.  Take your pain medication as prescribed, if needed.  If narcotic pain medicine is not needed, then you may take acetaminophen (Tylenol) or ibuprofen (Advil) as needed. 2. Take your usually prescribed medications unless otherwise directed. 3. If you need a refill on your pain medication, please contact your pharmacy. They will contact our office to request authorization.  Prescriptions will not be filled after 5pm or on week-ends. 4. You should follow a light diet the first few days after arrival home, such as soup and crackers, pudding, etc.unless your doctor has advised otherwise. A high-fiber, low fat diet can be resumed as tolerated.   Be sure to include lots of fluids daily. Most patients will experience some swelling and bruising on the chest and neck area.  Ice packs will help.  Swelling and bruising can take several days to resolve 5. Most patients will experience some swelling and bruising in the area of the incision. Ice pack will help. Swelling and bruising can take several days to resolve..  6. It is common to experience some constipation if taking pain medication after surgery.  Increasing fluid intake and taking a stool softener will usually help or prevent this problem from occurring.  A mild laxative (Milk of Magnesia or Miralax) should be taken according to package directions if there are no bowel movements after 48 hours. 7.  You may have steri-strips (small skin tapes) in place directly over the incision.  These strips should be left on the skin for 7-10 days.  If your  surgeon used skin glue on the incision, you may shower in 24 hours.  The glue will flake off over the next 2-3 weeks.  Any sutures or staples will be removed at the office during your follow-up visit. You may find that a light gauze bandage over your incision may keep your staples from being rubbed or pulled. You may shower and replace the bandage daily. 8. ACTIVITIES:  You may resume regular (light) daily activities beginning the next day--such as daily self-care, walking, climbing stairs--gradually increasing activities as tolerated.  You may have sexual intercourse when it is comfortable.  Refrain from any heavy lifting or straining until approved by your doctor. a. You may drive when you no longer are taking prescription pain medication, you can comfortably wear a seatbelt, and you can safely maneuver your car and apply brakes b. Return to Work: ___________________________________ 9. You should see your doctor in the office for a follow-up appointment approximately two weeks after your surgery.  Make sure that you call for this appointment within a day or two after you arrive home to insure a convenient appointment time. OTHER INSTRUCTIONS:  _____________________________________________________________ _____________________________________________________________  WHEN TO CALL YOUR DOCTOR: 1. Fever over 101.0 2. Inability to urinate 3. Nausea and/or vomiting 4. Extreme swelling or bruising 5. Continued bleeding from incision. 6. Increased pain, redness, or drainage from the incision. 7. Difficulty swallowing or breathing 8. Muscle cramping or spasms. 9. Numbness or tingling in hands or feet or around lips.  The clinic staff is available to   answer your questions during regular business hours.  Please don't hesitate to call and ask to speak to one of the nurses if you have concerns.  For further questions, please visit [email or web address]   

## 2015-02-12 NOTE — Progress Notes (Signed)
Patient ID: [name] [name], female   DOB: September 30, 1956, 59 y.o.   MRN: [id number] 4 Days Post-Op  Subjective: No complaints. Of walking in the halls. Tolerating a soft diet without nausea. She has passed flatus but no bowel movement. Pain well controlled with oral medications.  Objective: Vital signs in last 24 hours: Temp:  [98.1 F (36.7 C)-98.8 F (37.1 C)] 98.8 F (37.1 C) (03/20 0542) Pulse Rate:  [68-78] 68 (03/20 0542) Resp:  [18] 18 (03/20 0542) BP: (125-134)/(64-82) 125/64 mmHg (03/20 0542) SpO2:  [95 %-99 %] 96 % (03/20 0542) Last BM Date: 02/07/15  Intake/Output from previous day: 03/19 0701 - 03/20 0700 In: 1939.2 [P.O.:720; I.V.:1219.2] Out: 5900 [Urine:5900] Intake/Output this shift:    General appearance: alert, cooperative and no distress GI: normal findings: soft, non-tender and nondistended Incision/Wound: clean and dry without erythema or drainage  Lab Results:   Recent Labs  02/10/15 0550 02/11/15 0420  WBC 8.0 7.3  HGB 10.7* 10.5*  HCT 32.6* 31.8*  PLT 226 217   BMET  Recent Labs  02/10/15 0550  NA 141  K 4.0  CL 107  CO2 26  GLUCOSE 100*  BUN 8  CREATININE 0.75  CALCIUM 8.9     Studies/Results: No results found.  Anti-infectives: Anti-infectives    Start     Dose/Rate Route Frequency Ordered Stop   02/08/15 0625  cefoTEtan (CEFOTAN) 2 g in dextrose 5 % 50 mL IVPB     2 g 100 mL/hr over 30 Minutes Intravenous On call to O.R. 02/08/15 1245 02/08/15 0845      Assessment/Plan: s/p Procedure(s): LAPAROSCOPIC PARTIAL COLECTOMY Doing well postoperatively without complication. She is anxious to go home and I think this would be fine.   LOS: 4 days    Trudee Chirino T 02/12/2015

## 2015-02-12 NOTE — Discharge Summary (Signed)
   Patient ID: [name] [name] [id number] 59 y.o. 04-Mar-1956  02/08/2015  Discharge date and time: 02/12/2015   Admitting Physician: Excell Seltzer T  Discharge Physician: Excell Seltzer T  Admission Diagnoses: Crohn's colitis with stricture  Discharge Diagnoses: Same  Operations: Procedure(s): LAPAROSCOPIC PARTIAL COLECTOMY  Admission Condition: fair  Discharged Condition: good  Indication for Admission: Patient is a 59 year old female with a long history of Crohn's colitis. She has developed a progressive stricture of her distal transverse colon and a recent colonoscopy was felt to be partially obstructive confirmed on CT scan with a dilated proximal colon. We recommended elective laparoscopic partial colectomy and she is admitted for this procedure.  Hospital Course: On the day of admission the patient underwent an uneventful laparoscopic assisted partial colectomy with primary anastomosis. She tolerated the procedure well. Her postoperative course was uncomplicated. She did not have nausea and was started on a liquid diet on the second postoperative day. She tolerated this well and her diet was gradually advanced to a soft diet. She had some expected discomfort well controlled with medications. CBC was unremarkable and there was no evidence of infection. On the day of discharge she is tolerating a soft diet. She has had flatus but no bowel movement yet. Abdomen is soft and nontender and wounds clean. She is anxious to go home and felt ready for discharge.   Disposition: Home  Patient Instructions:    Medication List    TAKE these medications        Adalimumab 40 MG/0.8ML Pnkt  Commonly known as:  HUMIRA PEN  Inject 40 mg into the skin every 14 (fourteen) days. After the completion of the starter kit     calcium-vitamin D 250-100 MG-UNIT per tablet  Take 1 tablet by mouth 2 (two) times daily.     carboxymethylcellulose 0.5 % Soln  Commonly known as:  REFRESH PLUS   Place 1 drop into both eyes 3 (three) times daily as needed (Dry eyes).     CO Q-10 PO  Take 1 tablet by mouth daily.     multivitamin tablet  Take 1 tablet by mouth daily.     niacin 100 MG tablet  Take 100 mg by mouth daily with breakfast.     oxyCODONE-acetaminophen 5-325 MG per tablet  Commonly known as:  PERCOCET/ROXICET  Take 1-2 tablets by mouth every 4 (four) hours as needed for moderate pain.     vitamin B-12 100 MCG tablet  Commonly known as:  CYANOCOBALAMIN  Take 50 mcg by mouth daily.        Activity: no heavy lifting for 4 weeks Diet: on a soft diet to gradually advance as tolerated Wound Care: none needed  Follow-up:  With Dr. Excell Seltzer in 4 daysFor staple removal.  Signed: Edward Jolly MD, FACS  02/12/2015, 9:31 AM

## 2015-02-12 NOTE — Progress Notes (Signed)
Assessment unchanged. Pt verbalized understanding of dc instructions through teach back. Understands to follow-up in office this week for staple removal per Dr. Lear Ng instructions. Script x 1 provided. Discharged via wc to meet friend and awaiting vehicle to carry home. Accompanied by NT.

## 2015-03-22 ENCOUNTER — Other Ambulatory Visit: Payer: Self-pay | Admitting: Internal Medicine

## 2015-04-19 ENCOUNTER — Telehealth: Payer: Self-pay | Admitting: Internal Medicine

## 2015-04-19 NOTE — Telephone Encounter (Signed)
Vaughan Basta, please investigate. Thanks

## 2015-04-19 NOTE — Telephone Encounter (Signed)
Pt states she has noticed that when she was in the sun recently she broke out in a rash with red bumps on the front and back of her legs and arms. Humira information mentions that it may cause photosensitivity and that Drug-induced photosensitivity reactions are more often caused by UV-A light.1 Look for sunscreens that contain avobenzone, titanium dioxide, and/or zinc oxide which are more effective in blocking damaging UV-A light. Dr. Henrene Pastor please advise.

## 2015-04-19 NOTE — Telephone Encounter (Signed)
Agree with sun avoidance when possible and sunscreens as noted.  If rash severe, could see dermatologist.  Otherwise, be careful and continue on Humira

## 2015-04-19 NOTE — Telephone Encounter (Signed)
Spoke with pt and she is aware.

## 2015-07-25 ENCOUNTER — Ambulatory Visit (INDEPENDENT_AMBULATORY_CARE_PROVIDER_SITE_OTHER): Payer: 59 | Admitting: Physician Assistant

## 2015-07-25 VITALS — BP 132/80 | HR 85 | Temp 98.4°F | Resp 18 | Ht 63.0 in | Wt 200.0 lb

## 2015-07-25 DIAGNOSIS — R21 Rash and other nonspecific skin eruption: Secondary | ICD-10-CM | POA: Diagnosis not present

## 2015-07-25 LAB — POCT SKIN KOH: SKIN KOH, POC: NEGATIVE

## 2015-07-25 NOTE — Progress Notes (Signed)
07/25/2015 at 9:35 PM  [name] [name] / DOB: April 26, 1956 / MRN: [id number]  The patient has CROHN'S [place]; CROHN'S DISEASE; ABNORMAL TRANSAMINASE, (LFT'S); DIARRHEA; and Crohn's colitis on her problem list.  SUBJECTIVE  [name] [name] is a 59 y.o. well appearing female presenting for the chief complaint of rash that started on her feet bilaterally 3 months ago.  Since that time the rash has migrated to up her legs and arms. The rash does not itch and there is no pain.  She has a few beers a week.  She does not smoke or use illicit drugs.  She is taking Humira for her history of Chron disease and this was prescribed roughly 1 year ago.  Aside from the rash she feels well today.     She  has a past medical history of Crohn disease; Contact lens/glasses fitting; Breast mass, right (08/24/2013); Diverticulosis; Abnormal LFTs (severaal years agio, medication related); and Anemia.    Medications reviewed and updated by myself where necessary, and exist elsewhere in the encounter.   Ms. Fei has No Known Allergies. She  reports that she quit smoking about 26 years ago. Her smoking use included Cigarettes. She has a 20 pack-year smoking history. She has never used smokeless tobacco. She reports that she drinks about 1.2 - 1.8 oz of alcohol per week. She reports that she does not use illicit drugs. She  has no sexual activity history on file. The patient  has past surgical history that includes adnoids removed; Breast lumpectomy with needle localization (Right, 11/10/2013); Breast surgery; Abdominal hysterectomy (2003); and Laparoscopic partial colectomy (N/A, 02/08/2015).  Her family history includes Breast cancer in her paternal aunt; Cancer in her father; Diabetes in her brother, maternal grandfather, and mother; Heart disease in her maternal grandfather and paternal grandfather; Lung cancer in her father; Stomach cancer in her maternal aunt. There is no history of Colon cancer, Esophageal  cancer, or Rectal cancer.  Review of Systems  Constitutional: Negative.   HENT: Negative.   Eyes: Negative.   Respiratory: Negative.   Cardiovascular: Negative.   Gastrointestinal: Negative.   Genitourinary: Negative.   Musculoskeletal: Negative.   Skin: Positive for rash.  Neurological: Negative.   Endo/Heme/Allergies: Negative.   Psychiatric/Behavioral: Negative.     OBJECTIVE  Her  height is 5' 3"  (1.6 m) and weight is 200 lb (90.719 kg). Her oral temperature is 98.4 F (36.9 C). Her blood pressure is 132/80 and her pulse is 85. Her respiration is 18 and oxygen saturation is 98%.  The patient's body mass index is 35.44 kg/(m^2).  Physical Exam  Constitutional: She is oriented to person, place, and time. She appears well-developed and well-nourished. No distress.  Eyes: Conjunctivae and EOM are normal. Pupils are equal, round, and reactive to light.  Neck: No thyromegaly present.  Cardiovascular: Normal rate.   Respiratory: Effort normal.  GI: She exhibits no distension.  Musculoskeletal: Normal range of motion.  Neurological: She is alert and oriented to person, place, and time. She has normal reflexes.  Skin: Skin is warm and dry. Rash noted. Rash is maculopapular. She is not diaphoretic.  The rash is isolated to the arms and legs.      Results for orders placed or performed in visit on 07/25/15 (from the past 24 hour(s))  POCT Skin KOH     Status: Normal   Collection Time: 07/25/15  5:22 PM  Result Value Ref Range   Skin KOH, POC Negative  ASSESSMENT & PLAN  [name] was seen today for rash.  Diagnoses and all orders for this visit:  Rash and nonspecific skin eruption: I do not know what is causing her rash but suspect this may be 2/2 to Humira administration, however this would be odd as well as she has been on this medication for 1 year now.  Her vital signs are normal and she feels well today.  -     POCT Skin KOH -     Ambulatory referral to Dermatology -      Dermatology pathology    The patient was advised to call or come back to clinic if she does not see an improvement in symptoms, or worsens with the above plan.   Philis Fendt, MHS, PA-C [facility] 07/25/2015 9:35 PM

## 2015-08-04 ENCOUNTER — Telehealth: Payer: Self-pay

## 2015-08-04 NOTE — Telephone Encounter (Signed)
Called pt to schedule PPD skin test, pt is taking Humira. Pt states she will call back to schedule when she is going to be in town for several days. Pt did state that she saw her PCP and she has a skin rash all over her arms and legs that is red and splotchy. States the provider thinks it may by secondary to the Humira. She has an appt with dermatologist in October. Pt wanted to make sure Dr. Henrene Pastor know. Dr. Henrene Pastor notified.

## 2015-08-04 NOTE — Telephone Encounter (Signed)
Agree with formal dermatology opinion

## 2015-08-09 ENCOUNTER — Telehealth: Payer: Self-pay

## 2015-08-09 MED ORDER — ADALIMUMAB 40 MG/0.8ML ~~LOC~~ AJKT: 1.0000 "pen " | AUTO-INJECTOR | SUBCUTANEOUS | Status: DC

## 2015-08-09 NOTE — Telephone Encounter (Signed)
Faxed refill to [facility]

## 2015-08-21 ENCOUNTER — Ambulatory Visit (INDEPENDENT_AMBULATORY_CARE_PROVIDER_SITE_OTHER): Payer: 59 | Admitting: Internal Medicine

## 2015-08-21 DIAGNOSIS — K50919 Crohn's disease, unspecified, with unspecified complications: Secondary | ICD-10-CM | POA: Diagnosis not present

## 2015-08-23 LAB — TB SKIN TEST
INDURATION: 0 mm
TB SKIN TEST: NEGATIVE

## 2015-11-07 ENCOUNTER — Ambulatory Visit (INDEPENDENT_AMBULATORY_CARE_PROVIDER_SITE_OTHER): Payer: Self-pay | Admitting: Family Medicine

## 2015-11-07 VITALS — BP 138/88 | HR 57 | Temp 97.7°F | Resp 16 | Ht 63.0 in | Wt 203.8 lb

## 2015-11-07 DIAGNOSIS — Z024 Encounter for examination for driving license: Secondary | ICD-10-CM

## 2015-11-07 DIAGNOSIS — Z021 Encounter for pre-employment examination: Secondary | ICD-10-CM

## 2015-11-07 NOTE — Patient Instructions (Signed)
2 year card. Follow up with primary provider as planned - small blood in urine - no concerns on your exam otherwise.

## 2015-11-07 NOTE — Progress Notes (Addendum)
Subjective:  By signing my name below, I, [name] [name] [name], attest that this documentation has been prepared under the direction and in the presence of [name] Ray, MD.  [name] [name] [name], Medical Scribe. 11/07/2015.  11:06 AM.    Patient ID: [name] [name] [name], female    DOB: Apr 29, 1956, 59 y.o.   MRN: [id number]  Chief Complaint  Patient presents with  . Employment Physical    DOT    HPI HPI Comments: [name] [name] [name] is a 59 y.o. female who presents to [facility] complaining of DOT employment physical exam. Pt works for [employer]. She started driving in 0923.   Anemia: Lab Results  Component Value Date   WBC 7.3 02/11/2015   HGB 10.5* 02/11/2015   HCT 31.8* 02/11/2015   MCV 88.8 02/11/2015   PLT 217 02/11/2015   Crohn's Disease: Pt has a hx of Crohn's disease. She indicates that she underwent a colectomy procedure last year, and she reports that she no longer suffers from the Intense non debilitating digestive pain that she used to experience prior to the surgery. She takes humara, been on this for over a year. Pt is experiencing rash as a side effect of humara. She followed up with a dermatologist for this and she was diagnosed with psoriasis.  Pt denies loss of limps, hands, or feet sensation; other side effects or sedation secondary to the medication.    Vision:  Pt wears correcting lenses.  Visual Acuity Screening   Right eye Left eye Both eyes  Without correction:     With correction: 20/20 20/20 20/20   Comments: Peripheral Vision: Right eye 85 degrees. Left eye 85 degrees.The patient can distinguish the colors red, amber and green.  Hearing Screening Comments: The patient was able to hear a forced whisper from 10 feet. She denies any vision complications, or diagnosed hx of glaucoma.  Sleep: Pt denies daytime somnolence, snoring, or hx of sleep apnea.    Patient Active Problem List   Diagnosis Date Noted  . Crohn's colitis ([place]) 02/08/2015  .  DIARRHEA 01/03/2011  . ABNORMAL TRANSAMINASE, (LFT'S) 06/07/2009  . 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 CROHN'S DISEASE 03/08/2009   Past Medical History  Diagnosis Date  . Crohn disease ([place])   . Contact lens/glasses fitting     wears contacts or glasses  . Breast mass, right 08/24/2013    Excised 11/10/13. 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 Diverticulosis   . Abnormal LFTs severaal years agio, medication related  . Anemia    Past Surgical History  Procedure Laterality Date  . Adnoids removed    . Breast lumpectomy with needle localization Right 11/10/2013    Procedure: RIGHT BREAST MASS NEEDLE LOCALIZATION;  Surgeon: [name], MD;  Location: [facility];  Service: General;  Laterality: Right;  . Breast surgery    . Abdominal hysterectomy  2003    lt SO-rt ovarian cysy  . Laparoscopic partial colectomy N/A 02/08/2015    Procedure: LAPAROSCOPIC PARTIAL COLECTOMY;  Surgeon: Excell Seltzer, MD;  Location: [facility];  Service: General;  Laterality: N/A;   No Known Allergies Prior to Admission medications   Medication Sig Start Date End Date Taking? Authorizing Provider  Adalimumab (HUMIRA PEN) 40 MG/0.8ML PNKT Inject 1 pen into the skin every 14 (fourteen) days. 08/09/15  Yes Irene Shipper, MD  calcium-vitamin D 250-100 MG-UNIT per tablet Take 1 tablet by mouth 2 (two) times daily.   Yes  Historical Provider, MD  Coenzyme Q10 (CO Q-10 [name] [name]) Take 1 tablet by mouth daily.   Yes Historical Provider, MD  Multiple Vitamin (MULTIVITAMIN) tablet Take 1 tablet by mouth daily.   Yes Historical Provider, MD  vitamin B-12 (CYANOCOBALAMIN) 100 MCG tablet Take 50 mcg by mouth daily.   Yes Historical Provider, MD  carboxymethylcellulose (REFRESH PLUS) 0.5 % SOLN Place 1 drop into both eyes 3 (three) times daily as needed (Dry eyes).    Historical Provider, MD  niacin 100 MG tablet Take 100 mg by mouth daily with breakfast.    Historical Provider, MD    Social History   Social History  . Marital Status: Married    Spouse Name: N/A  . Number of Children: 0  . 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 Smoking status: Former Smoker -- 2.00 packs/day for 10 years    Types: Cigarettes    Quit date: 08/24/1988  . Smokeless tobacco: Never Used  . Alcohol Use: 1.2 - 1.8 oz/week    2-3 Standard drinks or equivalent per week     Comment: social  . Drug Use: No  . Sexual Activity: Not on file   Other Topics Concern  . Not on file   Social History Narrative    Review of Systems  Constitutional: Negative for fatigue.  Skin: Positive for rash.  Neurological: Negative for weakness.       Objective:   Physical Exam  Constitutional: She is oriented to person, place, and time. She appears well-developed and well-nourished. No distress.  HENT:  Head: Normocephalic and atraumatic.  Right Ear: External ear normal.  Left Ear: External ear normal.  Mouth/Throat: Oropharynx is clear and moist.  Eyes: Conjunctivae and EOM are normal. Pupils are equal, round, and reactive to light.  Neck: Normal range of motion. Neck supple. No thyromegaly present.  Cardiovascular: Normal rate, regular rhythm, normal heart sounds and intact distal pulses.   No murmur heard. Pulmonary/Chest: Effort normal and breath sounds normal. No respiratory distress. She has no wheezes.  Abdominal: Soft. Bowel sounds are normal. There is no tenderness.  Well healed scar in the lower mid abdomen.   Musculoskeletal: Normal range of motion. She exhibits no edema or tenderness.  Lymphadenopathy:    She has no cervical adenopathy.  Neurological: She is alert and oriented to person, place, and time. No cranial nerve deficit.  Skin: Skin is warm and dry. No rash noted.  Diffused erythematous  patches on the lower extremities and forearms.   Psychiatric: She has a normal mood and affect. Her behavior  is normal. Thought content normal.  Nursing note and vitals reviewed.   Filed Vitals:   11/07/15 1010  BP: 138/88  Pulse: 57  Temp: 97.7 F (36.5 C)  TempSrc: Oral  Resp: 16  Height: 5' 3"  (1.6 m)  Weight: 203 lb 12.8 oz (92.443 kg)  SpO2: 98%      Assessment & Plan:  [name] [name] [name] is a 59 y.o. female Encounter for commercial driver medical examination (CDME)  -History of Crohn's disease, stable. Even when she had flares, did not affect driving, was able to seek care if symptoms recurred. Denies acute issues. Reassuring exam. Small blood noted on urine, she states she has had CT scans and evaluations of this in the past. Has follow-up with  OB/GYN seen, will discuss any necessary further testing with her OB/GYN. 2  year card. See paperwork  No orders of the defined types were placed in this encounter.   Patient Instructions  2 year card. Follow up with primary provider as planned - small blood in urine - no concerns on your exam otherwise.

## 2015-12-05 ENCOUNTER — Telehealth: Payer: Self-pay | Admitting: Internal Medicine

## 2015-12-05 NOTE — Telephone Encounter (Signed)
Patient states that she will be going out of town on Monday and is needing this to be mailed to her by then.

## 2015-12-06 ENCOUNTER — Telehealth: Payer: Self-pay | Admitting: Internal Medicine

## 2015-12-06 MED ORDER — ADALIMUMAB 40 MG/0.8ML ~~LOC~~ AJKT: 1.0000 "pen " | AUTO-INJECTOR | SUBCUTANEOUS | Status: DC

## 2015-12-06 NOTE — Telephone Encounter (Signed)
Script sent to pharmacy and pt aware.

## 2015-12-06 NOTE — Telephone Encounter (Signed)
Left message for pt to call back.  Pt to come and give a copy of new insurance card and prior auth paperwork for humira.

## 2015-12-07 ENCOUNTER — Telehealth: Payer: Self-pay | Admitting: Internal Medicine

## 2015-12-07 NOTE — Telephone Encounter (Signed)
Papers faxed to pharmacy and pt aware.

## 2016-03-15 ENCOUNTER — Other Ambulatory Visit: Payer: Self-pay | Admitting: Internal Medicine

## 2016-06-05 DIAGNOSIS — Z0142 Encounter for cervical smear to confirm findings of recent normal smear following initial abnormal smear: Secondary | ICD-10-CM | POA: Diagnosis not present

## 2016-06-05 DIAGNOSIS — R87622 Low grade squamous intraepithelial lesion on cytologic smear of vagina (LGSIL): Secondary | ICD-10-CM | POA: Diagnosis not present

## 2016-06-05 DIAGNOSIS — Z8742 Personal history of other diseases of the female genital tract: Secondary | ICD-10-CM | POA: Diagnosis not present

## 2016-06-05 DIAGNOSIS — R87612 Low grade squamous intraepithelial lesion on cytologic smear of cervix (LGSIL): Secondary | ICD-10-CM | POA: Diagnosis not present

## 2016-06-07 ENCOUNTER — Other Ambulatory Visit: Payer: Self-pay | Admitting: Internal Medicine

## 2016-07-24 ENCOUNTER — Ambulatory Visit: Payer: BLUE CROSS/BLUE SHIELD | Admitting: Internal Medicine

## 2016-07-25 ENCOUNTER — Telehealth: Payer: Self-pay

## 2016-07-25 MED ORDER — ADALIMUMAB 40 MG/0.8ML ~~LOC~~ AJKT: 1.0000 "pen " | AUTO-INJECTOR | SUBCUTANEOUS | 1 refills | Status: DC

## 2016-07-25 NOTE — Telephone Encounter (Signed)
Refilled Humira

## 2016-09-02 ENCOUNTER — Other Ambulatory Visit (INDEPENDENT_AMBULATORY_CARE_PROVIDER_SITE_OTHER): Payer: BLUE CROSS/BLUE SHIELD

## 2016-09-02 ENCOUNTER — Encounter: Payer: Self-pay | Admitting: Internal Medicine

## 2016-09-02 ENCOUNTER — Ambulatory Visit (INDEPENDENT_AMBULATORY_CARE_PROVIDER_SITE_OTHER): Payer: BLUE CROSS/BLUE SHIELD | Admitting: Internal Medicine

## 2016-09-02 VITALS — BP 124/82 | HR 80 | Ht 63.0 in | Wt 198.4 lb

## 2016-09-02 DIAGNOSIS — K56699 Other intestinal obstruction unspecified as to partial versus complete obstruction: Secondary | ICD-10-CM

## 2016-09-02 DIAGNOSIS — K5010 Crohn's disease of large intestine without complications: Secondary | ICD-10-CM

## 2016-09-02 LAB — CBC WITH DIFFERENTIAL/PLATELET
BASOS ABS: 0 10*3/uL (ref 0.0–0.1)
BASOS PCT: 0.7 % (ref 0.0–3.0)
EOS ABS: 0.2 10*3/uL (ref 0.0–0.7)
Eosinophils Relative: 2.7 % (ref 0.0–5.0)
HEMATOCRIT: 39.6 % (ref 36.0–46.0)
Hemoglobin: 13.8 g/dL (ref 12.0–15.0)
LYMPHS ABS: 2.3 10*3/uL (ref 0.7–4.0)
Lymphocytes Relative: 31.6 % (ref 12.0–46.0)
MCHC: 34.7 g/dL (ref 30.0–36.0)
MCV: 84.8 fl (ref 78.0–100.0)
Monocytes Absolute: 0.4 10*3/uL (ref 0.1–1.0)
Monocytes Relative: 6 % (ref 3.0–12.0)
NEUTROS ABS: 4.3 10*3/uL (ref 1.4–7.7)
NEUTROS PCT: 59 % (ref 43.0–77.0)
PLATELETS: 291 10*3/uL (ref 150.0–400.0)
RBC: 4.67 Mil/uL (ref 3.87–5.11)
RDW: 12.6 % (ref 11.5–15.5)
WBC: 7.4 10*3/uL (ref 4.0–10.5)

## 2016-09-02 LAB — COMPREHENSIVE METABOLIC PANEL
ALT: 22 U/L (ref 0–35)
AST: 19 U/L (ref 0–37)
Albumin: 4.2 g/dL (ref 3.5–5.2)
Alkaline Phosphatase: 66 U/L (ref 39–117)
BILIRUBIN TOTAL: 0.5 mg/dL (ref 0.2–1.2)
BUN: 15 mg/dL (ref 6–23)
CALCIUM: 9.9 mg/dL (ref 8.4–10.5)
CHLORIDE: 105 meq/L (ref 96–112)
CO2: 26 meq/L (ref 19–32)
CREATININE: 0.74 mg/dL (ref 0.40–1.20)
GFR: 85.48 mL/min (ref >60.00)
GLUCOSE: 100 mg/dL — AB (ref 70–99)
Potassium: 4.4 mEq/L (ref 3.5–5.1)
Sodium: 138 mEq/L (ref 135–145)
Total Protein: 7.9 g/dL (ref 6.0–8.3)

## 2016-09-02 NOTE — Progress Notes (Signed)
HISTORY OF PRESENT ILLNESS:  [name] [name] is a 60 y.o. female with long-standing Crohn's colitis with variable patient compliance, ineffective immune modulatory therapy with side effect of hepatotoxicity, on Humira a little over 2 years, symptomatic distal transverse colon stricture diagnosed on imaging and colonoscopy January 2016 status post segmental colonic resection with Dr. Excell Seltzer March 2016. I have not seen the patient since. He has continued on Humira except for a 2 month hiatus earlier this year after developing some skin issues. The patient did not seek medical attention regarding this. However has been back on Humira for several months now. Seeing dermatology and was diagnosed with psoriasis. Under control. No other issues. Has completed Twinrix vaccination series. Is overdue for TB testing. 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 He reports being completely asymptomatic. One formed bowel movement daily without blood or abdominal pain. No extra intestinal manifestations by history.  REVIEW OF SYSTEMS:  All non-GI ROS negative upon comprehensive review  Past Medical History:  Diagnosis Date  . Abnormal LFTs severaal years agio, medication related  . Anemia   . Breast mass, right 08/24/2013   Excised 11/10/13. 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 Contact lens/glasses fitting    wears contacts or glasses  . Crohn disease ([place])   . Diverticulosis     Past Surgical History:  Procedure Laterality Date  . ABDOMINAL HYSTERECTOMY  2003   lt SO-rt ovarian cysy  . adnoids removed    . BREAST LUMPECTOMY WITH NEEDLE LOCALIZATION Right 11/10/2013   Procedure: RIGHT BREAST MASS NEEDLE LOCALIZATION;  Surgeon: [name], MD;  Location: [facility];  Service: General;  Laterality: Right;  . BREAST SURGERY    . LAPAROSCOPIC PARTIAL COLECTOMY N/A 02/08/2015   Procedure: LAPAROSCOPIC PARTIAL COLECTOMY;  Surgeon: Excell Seltzer, MD;  Location: [facility];   Service: General;  Laterality: N/A;    Social History [name]  reports that she quit smoking about 28 years ago. Her smoking use included Cigarettes. She has a 20.00 pack-year smoking history. She has never used smokeless tobacco. She reports that she drinks about 1.2 - 1.8 oz of alcohol per week . She reports that she does not use drugs.  family history includes Breast cancer in her paternal aunt; Cancer in her father; Diabetes in her brother, maternal grandfather, and mother; Heart disease in her maternal grandfather and paternal grandfather; Lung cancer in her father; Stomach cancer in her maternal aunt.  No Known Allergies     PHYSICAL EXAMINATION: Vital signs: BP 124/82 (BP Location: Left Arm, Patient Position: Sitting, Cuff Size: Normal)   Pulse 80   Ht 5' 3"  (1.6 m)   Wt 198 lb 6.4 oz (90 kg)   BMI 35.14 kg/m   Constitutional: Pleasant, generally well-appearing, no acute distress Psychiatric: alert and oriented x3, cooperative Eyes: extraocular movements intact, anicteric, conjunctiva pink Mouth: oral pharynx moist, no lesions Neck: supple no lymphadenopathy Cardiovascular: heart regular rate and rhythm, no murmur Lungs: clear to auscultation bilaterally Abdomen: soft, nontender, nondistended, no obvious ascites, no peritoneal signs, normal bowel sounds, no organomegaly. Surgical incision well-healed Rectal: Deferred Extremities: no lower extremity edema bilaterally Skin: no lesions on visible extremities Neuro: No focal deficits. Nerves intact  ASSESSMENT:  #1. Long-standing Crohn's colitis on Humira. Status post segmental colectomy for colonic stricture March 2016 #2. Noncandidate for immunomodulating therapy #3. Vaccination series up-to-date #4. Overdue for TB testing #5. History of psoriasis. Followed by dermatology  PLAN:  #1. Continue Humira  40 mg every 2 weeks #2. Laboratories today including comprehensive metabolic panel, CBC, and QuantiFERON #3.  Routine GI follow-up one year. No later. Sooner if needed. Relook colonoscopy to be determined

## 2016-09-02 NOTE — Patient Instructions (Signed)
Your physician has requested that you go to the basement for lab work before leaving today  Please follow up in one year.

## 2016-09-03 DIAGNOSIS — L821 Other seborrheic keratosis: Secondary | ICD-10-CM | POA: Diagnosis not present

## 2016-09-03 DIAGNOSIS — L400 Psoriasis vulgaris: Secondary | ICD-10-CM | POA: Diagnosis not present

## 2016-09-04 LAB — QUANTIFERON TB GOLD ASSAY (BLOOD)
INTERFERON GAMMA RELEASE ASSAY: NEGATIVE
QUANTIFERON NIL VALUE: 0.05 [IU]/mL

## 2016-10-09 ENCOUNTER — Telehealth: Payer: Self-pay | Admitting: Internal Medicine

## 2016-10-09 MED ORDER — ADALIMUMAB 40 MG/0.8ML ~~LOC~~ AJKT: 1.0000 "pen " | AUTO-INJECTOR | SUBCUTANEOUS | 1 refills | Status: DC

## 2016-10-09 NOTE — Telephone Encounter (Signed)
Refilled Humira

## 2016-11-22 DIAGNOSIS — Z01419 Encounter for gynecological examination (general) (routine) without abnormal findings: Secondary | ICD-10-CM | POA: Diagnosis not present

## 2016-11-22 DIAGNOSIS — Z6835 Body mass index (BMI) 35.0-35.9, adult: Secondary | ICD-10-CM | POA: Diagnosis not present

## 2016-11-22 DIAGNOSIS — Z1231 Encounter for screening mammogram for malignant neoplasm of breast: Secondary | ICD-10-CM | POA: Diagnosis not present

## 2016-11-22 DIAGNOSIS — Z13820 Encounter for screening for osteoporosis: Secondary | ICD-10-CM | POA: Diagnosis not present

## 2016-12-09 ENCOUNTER — Telehealth: Payer: Self-pay | Admitting: Internal Medicine

## 2016-12-09 MED ORDER — ADALIMUMAB 40 MG/0.8ML ~~LOC~~ AJKT: 1.0000 "pen " | AUTO-INJECTOR | SUBCUTANEOUS | 1 refills | Status: DC

## 2016-12-09 NOTE — Telephone Encounter (Signed)
Refilled Humira

## 2016-12-10 ENCOUNTER — Telehealth: Payer: Self-pay | Admitting: Internal Medicine

## 2016-12-10 MED ORDER — ADALIMUMAB 40 MG/0.8ML ~~LOC~~ AJKT: 1.0000 "pen " | AUTO-INJECTOR | SUBCUTANEOUS | 1 refills | Status: DC

## 2016-12-10 NOTE — Telephone Encounter (Signed)
Sent humira to specialty pharmacy

## 2016-12-20 IMAGING — CT CT ABD-PELV W/ CM
2 of 5 series · 17 of 46 positions shown, 19 images · IV contrast (Omnipaque 300)
Comparison: 06/09/2014

CLINICAL DATA: Generalized abdominal pain.  Crohn's disease.

EXAM:
CT ABDOMEN AND PELVIS WITH CONTRAST
TECHNIQUE: Multidetector CT imaging of the abdomen and pelvis was performed
using the standard protocol following bolus administration of
intravenous contrast.
CONTRAST:  100mL OMNIPAQUE IOHEXOL 300 MG/ML  SOLN

[Series 2: abd/ pel 5mm · axial · 0.78mm/px · z∈[-443,-8]mm · 14 of 97 slices shown, 16 images]
[im 5/97  soft-tissue]
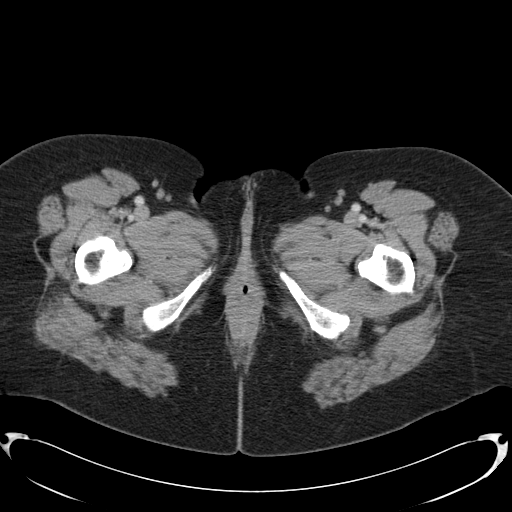
[im 5/97  bone]
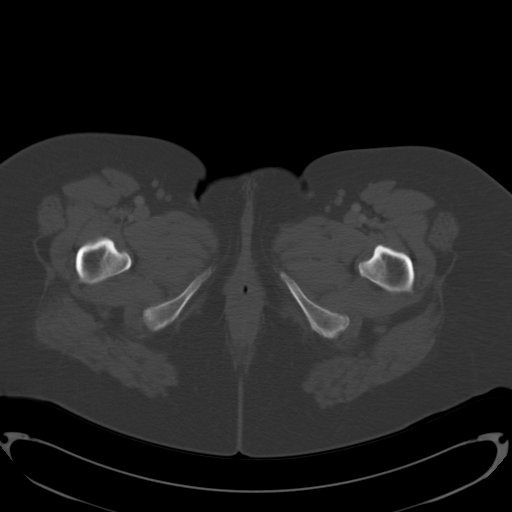
[im 15/97  soft-tissue]
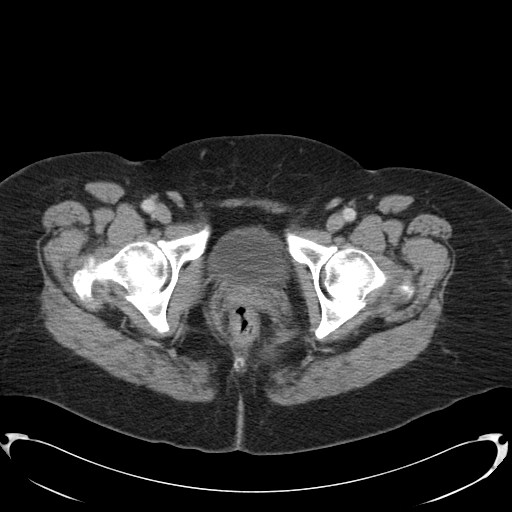
[im 20/97  soft-tissue]
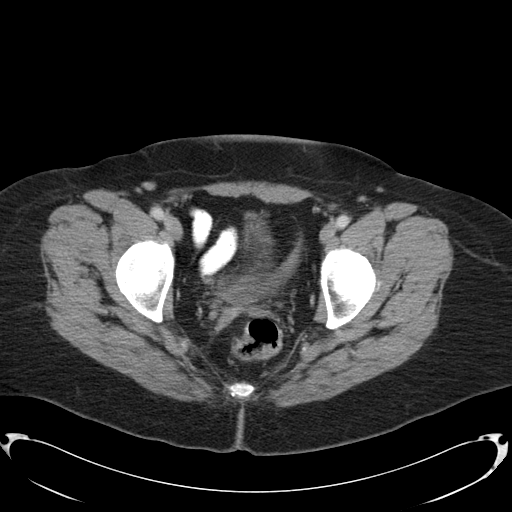
[im 25/97  soft-tissue]
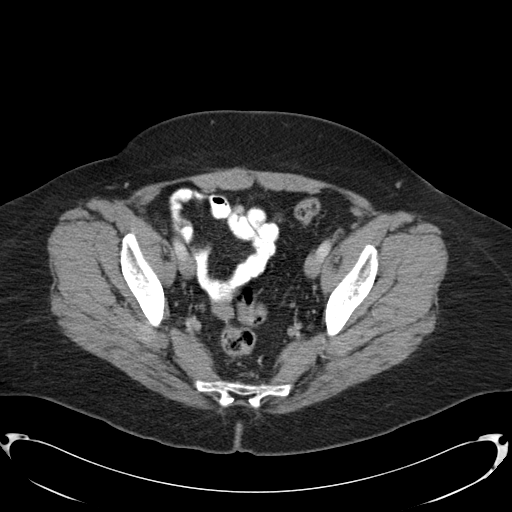
[im 34/97  soft-tissue]
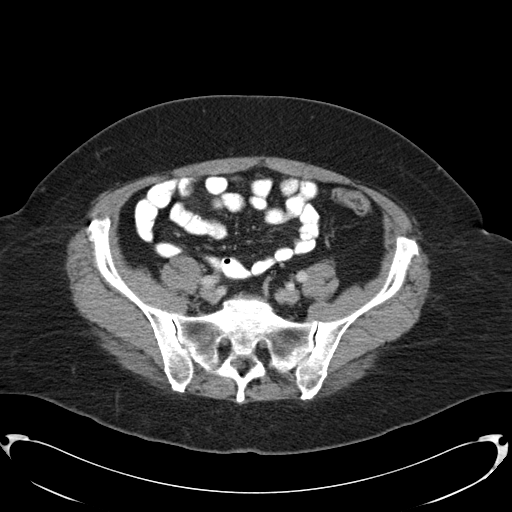
[im 39/97  soft-tissue]
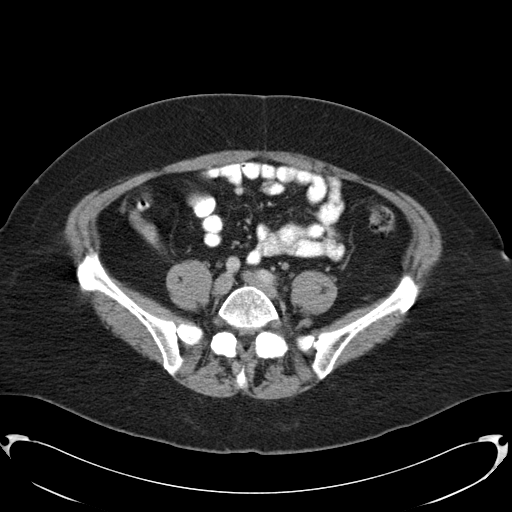
[im 44/97  soft-tissue]
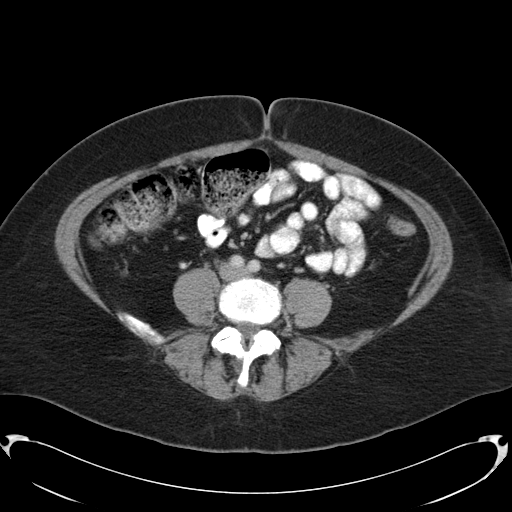
[im 53/97  soft-tissue]
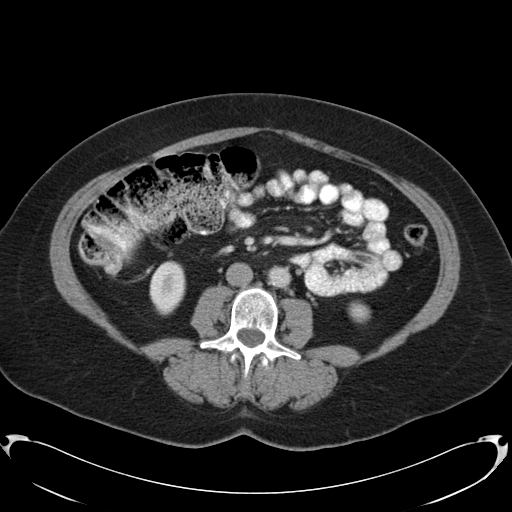
[im 58/97  soft-tissue]
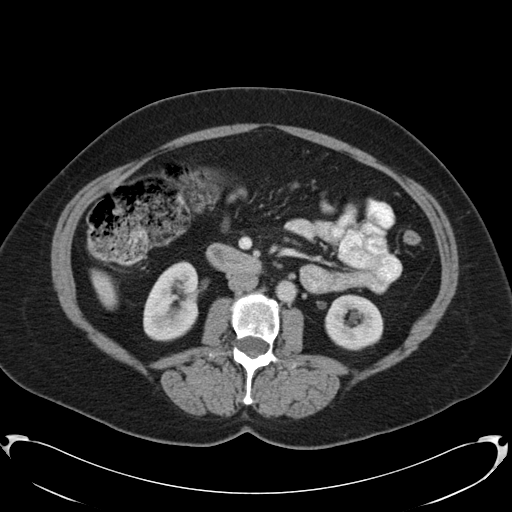
[im 58/97  bone]
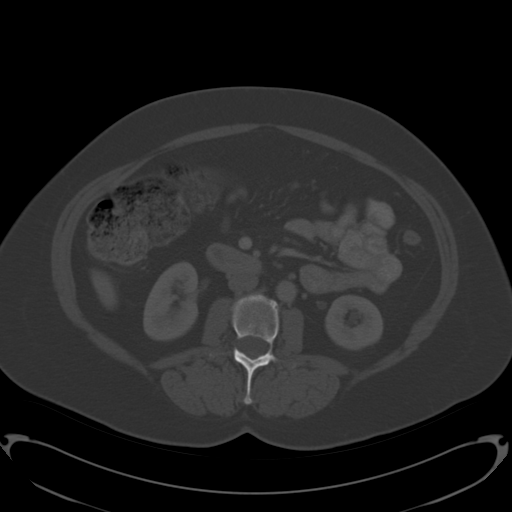
[im 63/97  soft-tissue]
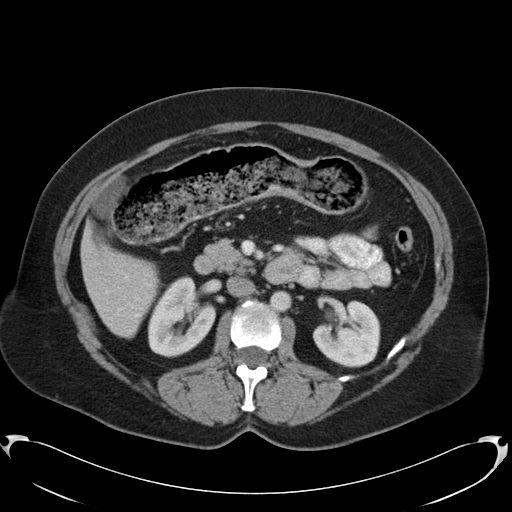
[im 73/97  soft-tissue]
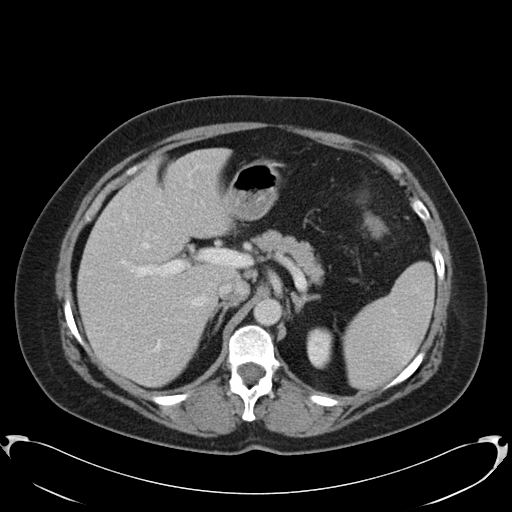
[im 77/97  soft-tissue]
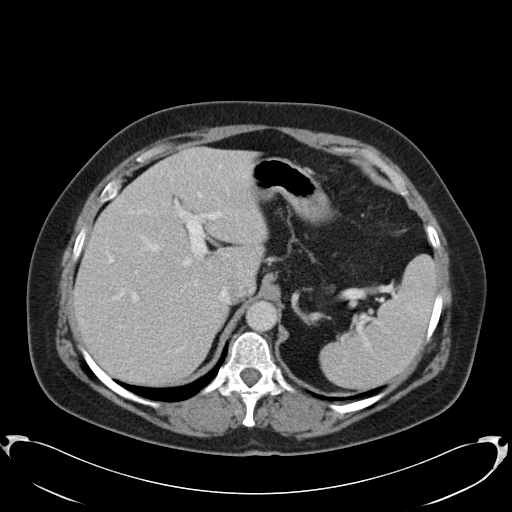
[im 82/97  soft-tissue]
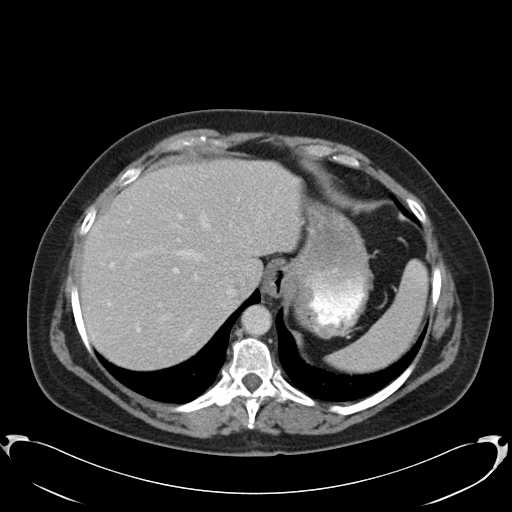
[im 92/97  soft-tissue]
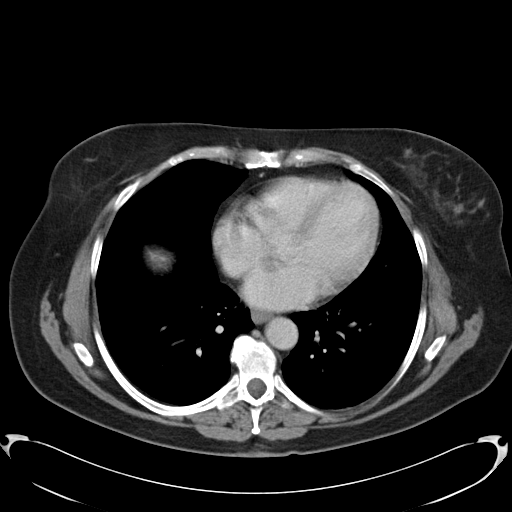

[Series 602: cor · coronal · 0.97mm/px · 3 of 120 slices shown]
[im 40/120  soft-tissue]
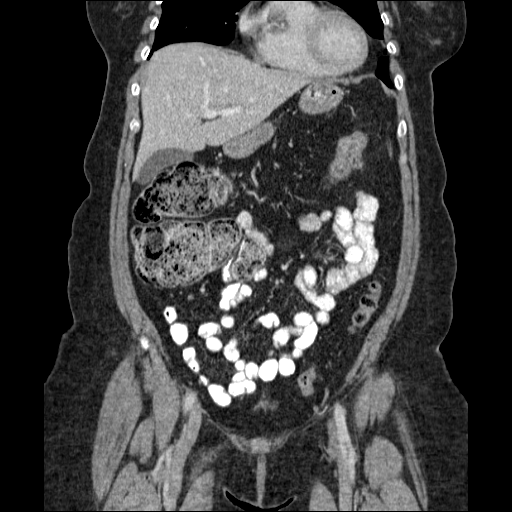
[im 53/120  soft-tissue]
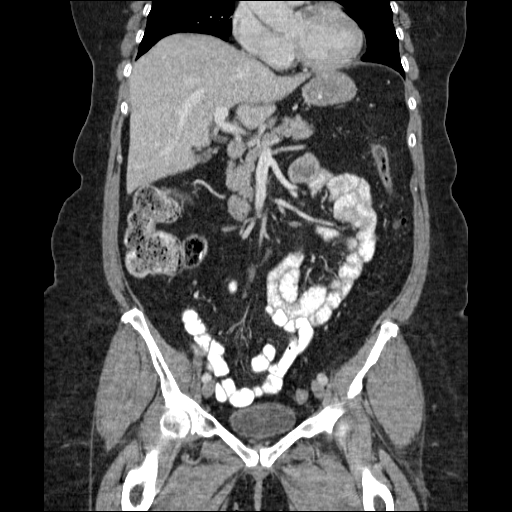
[im 67/120  soft-tissue]
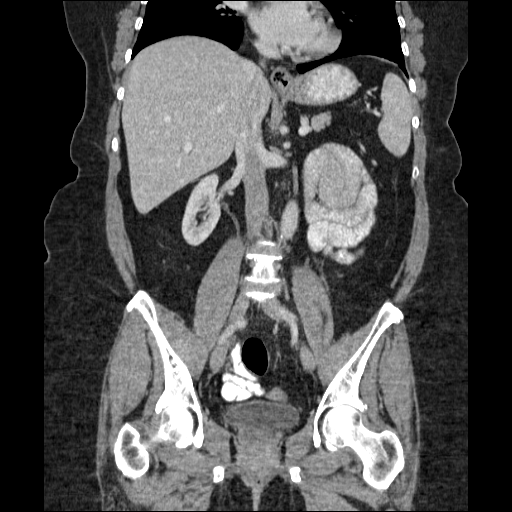

[17 of 46 positions shown; findings below may reference images not displayed]

FINDINGS: Lower Chest:  Unremarkable.

Hepatobiliary: No masses or other significant abnormality
identified.

Pancreas: No mass, inflammatory changes, or other parenchymal
abnormality identified.

Spleen:  Within normal limits in size and appearance.

Adrenal Glands:  No mass identified.

Kidneys/Urinary Tract: No masses identified. No evidence of
hydronephrosis.

Stomach/Bowel/Peritoneum: Interval decrease in wall thickening and
pericolonic inflammatory changes seen involving the distal
transverse colon. Persistent stricturing is seen at this site with
proximal colonic dilatation with stool. No evidence of other sites
of bowel wall thickening or involvement of terminal ileum. No
evidence extraluminal gas or fluid collections.

Vascular/Lymphatic: No pathologically enlarged lymph nodes
identified. No other significant abnormality identified.

Reproductive:  Prior hysterectomy again noted.

Other:  None.

Musculoskeletal:  No suspicious bone lesions identified.
IMPRESSION: Decreased short segment wall thickening and pericolonic inflammatory
change in the distal transverse colon. Persistent luminal narrowing
at this site and proximal colonic dilatation is suspicious for a
chronic stricture causing a partial colonic obstruction. Consider
colonoscopy for further evaluation.

No evidence of abscess or other acute findings.

## 2017-01-01 ENCOUNTER — Ambulatory Visit (INDEPENDENT_AMBULATORY_CARE_PROVIDER_SITE_OTHER): Payer: BLUE CROSS/BLUE SHIELD | Admitting: Emergency Medicine

## 2017-01-01 VITALS — BP 114/74 | HR 79 | Temp 98.7°F | Resp 16 | Ht 62.75 in | Wt 200.8 lb

## 2017-01-01 DIAGNOSIS — R350 Frequency of micturition: Secondary | ICD-10-CM | POA: Diagnosis not present

## 2017-01-01 DIAGNOSIS — Z23 Encounter for immunization: Secondary | ICD-10-CM

## 2017-01-01 DIAGNOSIS — R631 Polydipsia: Secondary | ICD-10-CM | POA: Diagnosis not present

## 2017-01-01 DIAGNOSIS — R358 Other polyuria: Secondary | ICD-10-CM

## 2017-01-01 LAB — POCT URINALYSIS DIP (MANUAL ENTRY)
BILIRUBIN UA: NEGATIVE
BILIRUBIN UA: NEGATIVE
Glucose, UA: NEGATIVE
Leukocytes, UA: NEGATIVE
Nitrite, UA: NEGATIVE
Protein Ur, POC: NEGATIVE
SPEC GRAV UA: 1.025
UROBILINOGEN UA: 0.2
pH, UA: 6

## 2017-01-01 LAB — GLUCOSE, POCT (MANUAL RESULT ENTRY): POC Glucose: 83 mg/dl (ref 70–99)

## 2017-01-01 LAB — POCT GLYCOSYLATED HEMOGLOBIN (HGB A1C): Hemoglobin A1C: 5.6

## 2017-01-01 NOTE — Progress Notes (Signed)
[name] 61 y.o.   Chief Complaint  Patient presents with  . DIABETES CHECK    per patient FAMILY HISTORY    HISTORY OF PRESENT ILLNESS: This is a 61 y.o. female complaining of 4 day h/o increased thirst, urination, and hunger. No other symptoms; concerned about diabetes.  HPI   Prior to Admission medications   Medication Sig Start Date End Date Taking? Authorizing Provider  Adalimumab (HUMIRA PEN) 40 MG/0.8ML PNKT Inject 1 pen into the skin every 14 (fourteen) days. 12/10/16  Yes Irene Shipper, MD  calcium-vitamin D 250-100 MG-UNIT per tablet Take 1 tablet by mouth 2 (two) times daily.   Yes Historical Provider, MD  carboxymethylcellulose (REFRESH PLUS) 0.5 % SOLN Place 1 drop into both eyes 3 (three) times daily as needed (Dry eyes).   Yes Historical Provider, MD  Coenzyme Q10 (CO Q-10 PO) Take 1 tablet by mouth daily.   Yes Historical Provider, MD  Multiple Vitamin (MULTIVITAMIN) tablet Take 1 tablet by mouth daily.   Yes Historical Provider, MD  niacin 100 MG tablet Take 100 mg by mouth daily with breakfast.   Yes Historical Provider, MD  vitamin B-12 (CYANOCOBALAMIN) 100 MCG tablet Take 50 mcg by mouth daily.   Yes Historical Provider, MD    No Known Allergies  Patient Active Problem List   Diagnosis Date Noted  . Crohn's colitis ([place]) 02/08/2015  . DIARRHEA 01/03/2011  . ABNORMAL TRANSAMINASE, (LFT'S) 06/07/2009  . 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 CROHN'S DISEASE 03/08/2009    Past Medical History:  Diagnosis Date  . Abnormal LFTs severaal years agio, medication related  . Anemia   . Breast mass, right 08/24/2013   Excised 11/10/13. 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 Contact lens/glasses fitting    wears contacts or glasses  . Crohn disease ([place])   . Diverticulosis     Past Surgical History:  Procedure Laterality Date  . ABDOMINAL HYSTERECTOMY  2003   lt SO-rt ovarian cysy  . adnoids removed    . BREAST LUMPECTOMY WITH NEEDLE  LOCALIZATION Right 11/10/2013   Procedure: RIGHT BREAST MASS NEEDLE LOCALIZATION;  Surgeon: [name], MD;  Location: [facility];  Service: General;  Laterality: Right;  . BREAST SURGERY    . LAPAROSCOPIC PARTIAL COLECTOMY N/A 02/08/2015   Procedure: LAPAROSCOPIC PARTIAL COLECTOMY;  Surgeon: Excell Seltzer, MD;  Location: [facility];  Service: General;  Laterality: N/A;    Social History   Social History  . Marital status: Married    Spouse name: N/A  . Number of children: 0  . Years of education: N/A   Occupational History  . over the road truck driver    Social History Main Topics  . Smoking status: Former Smoker    Packs/day: 2.00    Years: 10.00    Types: Cigarettes    Quit date: 08/24/1988  . Smokeless tobacco: Never Used  . Alcohol use 1.2 - 1.8 oz/week    2 - 3 Standard drinks or equivalent per week     Comment: social  . Drug use: No  . Sexual activity: Not on file   Other Topics Concern  . Not on file   Social History Narrative  . No narrative on file    Family History  Problem Relation Age of Onset  . Lung cancer Father   . Cancer Father   . Diabetes Mother   . Diabetes Brother   . Breast cancer Paternal Aunt   .  Stomach cancer Maternal Aunt   . Diabetes Maternal Grandfather   . Heart disease Maternal Grandfather   . Heart disease Paternal Grandfather   . Colon cancer Neg Hx   . Esophageal cancer Neg Hx   . Rectal cancer Neg Hx      Review of Systems  Constitutional: Negative for chills, fever and malaise/fatigue.  HENT: Negative for congestion, nosebleeds and sore throat.   Eyes: Negative for blurred vision, discharge and redness.  Respiratory: Negative for cough and shortness of breath.   Cardiovascular: Negative for chest pain, palpitations and leg swelling.  Gastrointestinal: Negative for abdominal pain, blood in stool, diarrhea, nausea and vomiting.  Genitourinary: Positive for frequency. Negative for dysuria, flank  pain, hematuria and urgency.  Musculoskeletal: Negative for myalgias and neck pain.  Skin: Negative for rash.  Neurological: Negative for dizziness, sensory change, focal weakness and headaches.  Endo/Heme/Allergies: Positive for polydipsia.       Increased thirst and hunger.  All other systems reviewed and are negative.  Vitals:   01/01/17 0821  BP: 114/74  Pulse: 79  Resp: 16  Temp: 98.7 F (37.1 C)    Physical Exam  Constitutional: She is oriented to person, place, and time. She appears well-developed and well-nourished.  HENT:  Head: Normocephalic and atraumatic.  Nose: Nose normal.  Mouth/Throat: Oropharynx is clear and moist.  Eyes: Conjunctivae and EOM are normal. Pupils are equal, round, and reactive to light.  Neck: Normal range of motion. Neck supple. No JVD present. No thyromegaly present.  Cardiovascular: Normal rate, regular rhythm and normal heart sounds.   Pulmonary/Chest: Effort normal and breath sounds normal.  Abdominal: Soft. There is no tenderness.  Musculoskeletal: Normal range of motion.  Lymphadenopathy:    She has no cervical adenopathy.  Neurological: She is alert and oriented to person, place, and time. No sensory deficit. She exhibits normal muscle tone.  Skin: Skin is warm and dry. Capillary refill takes less than 2 seconds.  Psychiatric: She has a normal mood and affect. Her behavior is normal.  Vitals reviewed.  Labs WNL. No diabetic findings.  ASSESSMENT & PLAN: Indica was seen today for diabetes check.  Diagnoses and all orders for this visit:  Frequency of urination and polyuria -     CBC with Differential/Platelet -     Comprehensive metabolic panel -     POCT glucose (manual entry) -     POCT glycosylated hemoglobin (Hb A1C) -     POCT urinalysis dipstick  Need for prophylactic vaccination and inoculation against influenza -     Flu Vaccine QUAD 36+ mos IM  Increased thirst   Advised to monitor symptoms. Will call with blood  results tomorrow. Return if worse or any significant changes.   Agustina Caroli, MD [facility]

## 2017-01-01 NOTE — Patient Instructions (Signed)
     IF you received an x-ray today, you will receive an invoice from [facility]. Please contact [facility] at [phone] with questions or concerns regarding your invoice.   IF you received labwork today, you will receive an invoice from [facility]. Please contact [facility] at [phone] with questions or concerns regarding your invoice.   Our billing staff will not be able to assist you with questions regarding bills from these companies.  You will be contacted with the lab results as soon as they are available. The fastest way to get your results is to activate your My Chart account. Instructions are located on the last page of this paperwork. If you have not heard from us regarding the results in 2 weeks, please contact this office.     

## 2017-01-02 LAB — CBC WITH DIFFERENTIAL/PLATELET
BASOS ABS: 0 10*3/uL (ref 0.0–0.2)
Basos: 0 %
EOS (ABSOLUTE): 0.2 10*3/uL (ref 0.0–0.4)
EOS: 3 %
HEMATOCRIT: 39.8 % (ref 34.0–46.6)
HEMOGLOBIN: 13.3 g/dL (ref 11.1–15.9)
IMMATURE GRANULOCYTES: 0 %
Immature Grans (Abs): 0 10*3/uL (ref 0.0–0.1)
Lymphocytes Absolute: 1.6 10*3/uL (ref 0.7–3.1)
Lymphs: 33 %
MCH: 29.8 pg (ref 26.6–33.0)
MCHC: 33.4 g/dL (ref 31.5–35.7)
MCV: 89 fL (ref 79–97)
MONOCYTES: 7 %
MONOS ABS: 0.3 10*3/uL (ref 0.1–0.9)
NEUTROS PCT: 57 %
Neutrophils Absolute: 2.8 10*3/uL (ref 1.4–7.0)
Platelets: 260 10*3/uL (ref 150–379)
RBC: 4.46 x10E6/uL (ref 3.77–5.28)
RDW: 13.2 % (ref 12.3–15.4)
WBC: 5 10*3/uL (ref 3.4–10.8)

## 2017-01-02 LAB — COMPREHENSIVE METABOLIC PANEL
ALK PHOS: 58 IU/L (ref 39–117)
ALT: 30 IU/L (ref 0–32)
AST: 24 IU/L (ref 0–40)
Albumin/Globulin Ratio: 1.5 (ref 1.2–2.2)
Albumin: 4.5 g/dL (ref 3.5–5.5)
BUN/Creatinine Ratio: 21 (ref 9–23)
BUN: 15 mg/dL (ref 6–24)
Bilirubin Total: 0.5 mg/dL (ref 0.0–1.2)
CALCIUM: 9.6 mg/dL (ref 8.7–10.2)
CO2: 23 mmol/L (ref 18–29)
CREATININE: 0.7 mg/dL (ref 0.57–1.00)
Chloride: 103 mmol/L (ref 96–106)
GFR calc Af Amer: 110 mL/min/{1.73_m2} (ref >59)
GFR, EST NON AFRICAN AMERICAN: 96 mL/min/{1.73_m2} (ref >59)
GLOBULIN, TOTAL: 3.1 g/dL (ref 1.5–4.5)
GLUCOSE: 106 mg/dL — AB (ref 65–99)
Potassium: 4.5 mmol/L (ref 3.5–5.2)
SODIUM: 140 mmol/L (ref 134–144)
Total Protein: 7.6 g/dL (ref 6.0–8.5)

## 2017-01-03 ENCOUNTER — Encounter: Payer: Self-pay | Admitting: Emergency Medicine

## 2017-01-30 DIAGNOSIS — H6123 Impacted cerumen, bilateral: Secondary | ICD-10-CM | POA: Diagnosis not present

## 2017-02-11 ENCOUNTER — Other Ambulatory Visit: Payer: Self-pay

## 2017-02-11 MED ORDER — ADALIMUMAB 40 MG/0.8ML ~~LOC~~ AJKT: 1.0000 "pen " | AUTO-INJECTOR | SUBCUTANEOUS | 1 refills | Status: DC

## 2017-02-14 ENCOUNTER — Telehealth: Payer: Self-pay | Admitting: Internal Medicine

## 2017-02-14 MED ORDER — ADALIMUMAB 40 MG/0.8ML ~~LOC~~ AJKT: 1.0000 "pen " | AUTO-INJECTOR | SUBCUTANEOUS | 1 refills | Status: DC

## 2017-02-14 NOTE — Telephone Encounter (Signed)
Humira sent to [facility]

## 2017-03-04 DIAGNOSIS — L814 Other melanin hyperpigmentation: Secondary | ICD-10-CM | POA: Diagnosis not present

## 2017-03-04 DIAGNOSIS — L400 Psoriasis vulgaris: Secondary | ICD-10-CM | POA: Diagnosis not present

## 2017-03-04 DIAGNOSIS — D1801 Hemangioma of skin and subcutaneous tissue: Secondary | ICD-10-CM | POA: Diagnosis not present

## 2017-03-04 DIAGNOSIS — L821 Other seborrheic keratosis: Secondary | ICD-10-CM | POA: Diagnosis not present

## 2017-09-29 ENCOUNTER — Ambulatory Visit: Payer: BLUE CROSS/BLUE SHIELD | Admitting: Family Medicine

## 2017-09-30 ENCOUNTER — Encounter: Payer: Self-pay | Admitting: Urgent Care

## 2017-09-30 ENCOUNTER — Ambulatory Visit (INDEPENDENT_AMBULATORY_CARE_PROVIDER_SITE_OTHER): Payer: Self-pay | Admitting: Urgent Care

## 2017-09-30 VITALS — BP 122/88 | HR 87 | Temp 99.0°F | Resp 17 | Ht 66.0 in | Wt 204.0 lb

## 2017-09-30 DIAGNOSIS — Z024 Encounter for examination for driving license: Secondary | ICD-10-CM

## 2017-09-30 DIAGNOSIS — Z9089 Acquired absence of other organs: Secondary | ICD-10-CM

## 2017-09-30 DIAGNOSIS — Z90710 Acquired absence of both cervix and uterus: Secondary | ICD-10-CM

## 2017-09-30 DIAGNOSIS — K50919 Crohn's disease, unspecified, with unspecified complications: Secondary | ICD-10-CM

## 2017-09-30 NOTE — Progress Notes (Signed)
  Commercial Driver Medical Examination   [name] [name] is a 61 y.o. female who presents today for a DOT physical exam. The patient reports history of Crohn's disease, hysterectomy, adenoidectomy all without sequelae. Denies smoking cigarettes. Has occasional drink of alcohol.  The following portions of the patient's history were reviewed and updated as appropriate: allergies, current medications, past family history, past medical history, past social history and past surgical history.  Objective:   BP 122/88   Pulse 87   Temp 99 F (37.2 C) (Oral)   Resp 17   Ht 5' 6"  (1.676 m)   Wt 204 lb (92.5 kg)   SpO2 98%   BMI 32.93 kg/m   Vision/hearing:  Visual Acuity Screening   Right eye Left eye Both eyes  Without correction:     With correction: 20/25 20/20 20/20   Hearing Screening Comments: Peripheral Vision: Right eye 85 degrees. Left eye 85 degrees. The patient can distinguish the colors red, amber and green. The patient was able to hear a forced whisper from L=10 R=10 feet.  Patient can recognize and distinguish among traffic control signals and devices showing standard red, green, and amber colors.  Corrective lenses required: Yes  Monocular Vision?: No  Hearing aid requirement: No  Physical Exam  Constitutional: She is oriented to person, place, and time. She appears well-developed and well-nourished.  HENT:  TM's intact bilaterally, no effusions or erythema. Nasal turbinates pink and moist, nasal passages patent. No sinus tenderness. Oropharynx clear, mucous membranes moist, dentition in good repair.  Eyes: Conjunctivae and EOM are normal. Pupils are equal, round, and reactive to light. Right eye exhibits no discharge. Left eye exhibits no discharge. No scleral icterus.  Neck: Normal range of motion. Neck supple.  Cardiovascular: Normal rate, regular rhythm and intact distal pulses. Exam reveals no gallop and no friction rub.  No murmur heard. Pulmonary/Chest: No  respiratory distress. She has no wheezes. She has no rales.  Abdominal: Soft. Bowel sounds are normal. She exhibits no distension and no mass. There is no tenderness.  Musculoskeletal: Normal range of motion. She exhibits no edema or tenderness.  Neurological: She is alert and oriented to person, place, and time. She has normal reflexes. She displays normal reflexes. Coordination normal.  Skin: Skin is warm and dry. No rash noted. No erythema. No pallor.  Psychiatric: She has a normal mood and affect.   Labs: Comments: Urine Specimen:  SpGr:  1.010   Blood:  Trace   Glucose:  Negative   Protein:  Negative  Assessment:    Healthy female exam.  Meets standards in 55 CFR 391.41;  qualifies for 2 year certificate.    Plan:   Medical examiners certificate completed and printed. Return as needed.  Jaynee Eagles, PA-C [facility] [phone] 09/30/2017  9:43 AM

## 2017-09-30 NOTE — Patient Instructions (Addendum)
Health Maintenance, Female Adopting a healthy lifestyle and getting preventive care can go a long way to promote health and wellness. Talk with your health care provider about what schedule of regular examinations is right for you. This is a good chance for you to check in with your provider about disease prevention and staying healthy. In between checkups, there are plenty of things you can do on your own. Experts have done a lot of research about which lifestyle changes and preventive measures are most likely to keep you healthy. Ask your health care provider for more information. Weight and diet Eat a healthy diet  Be sure to include plenty of vegetables, fruits, low-fat dairy products, and lean protein.  Do not eat a lot of foods high in solid fats, added sugars, or salt.  Get regular exercise. This is one of the most important things you can do for your health. ? Most adults should exercise for at least 150 minutes each week. The exercise should increase your heart rate and make you sweat (moderate-intensity exercise). ? Most adults should also do strengthening exercises at least twice a week. This is in addition to the moderate-intensity exercise.  Maintain a healthy weight  Body mass index (BMI) is a measurement that can be used to identify possible weight problems. It estimates body fat based on height and weight. Your health care provider can help determine your BMI and help you achieve or maintain a healthy weight.  For females 20 years of age and older: ? A BMI below 18.5 is considered underweight. ? A BMI of 18.5 to 24.9 is normal. ? A BMI of 25 to 29.9 is considered overweight. ? A BMI of 30 and above is considered obese.  Watch levels of cholesterol and blood lipids  You should start having your blood tested for lipids and cholesterol at 59 years of age, then have this test every 5 years.  You may need to have your cholesterol levels checked more often if: ? Your lipid or  cholesterol levels are high. ? You are older than 59 years of age. ? You are at high risk for heart disease.  Cancer screening Lung Cancer  Lung cancer screening is recommended for adults 55-80 years old who are at high risk for lung cancer because of a history of smoking.  A yearly low-dose CT scan of the lungs is recommended for people who: ? Currently smoke. ? Have quit within the past 15 years. ? Have at least a 30-pack-year history of smoking. A pack year is smoking an average of one pack of cigarettes a day for 1 year.  Yearly screening should continue until it has been 15 years since you quit.  Yearly screening should stop if you develop a health problem that would prevent you from having lung cancer treatment.  Breast Cancer  Practice breast self-awareness. This means understanding how your breasts normally appear and feel.  It also means doing regular breast self-exams. Let your health care provider know about any changes, no matter how small.  If you are in your 20s or 30s, you should have a clinical breast exam (CBE) by a health care provider every 1-3 years as part of a regular health exam.  If you are 40 or older, have a CBE every year. Also consider having a breast X-ray (mammogram) every year.  If you have a family history of breast cancer, talk to your health care provider about genetic screening.  If you are at high risk   for breast cancer, talk to your health care provider about having an MRI and a mammogram every year.  Breast cancer gene (BRCA) assessment is recommended for women who have family members with BRCA-related cancers. BRCA-related cancers include: ? Breast. ? Ovarian. ? Tubal. ? Peritoneal cancers.  Results of the assessment will determine the need for genetic counseling and BRCA1 and BRCA2 testing.  Cervical Cancer Your health care provider may recommend that you be screened regularly for cancer of the pelvic organs (ovaries, uterus, and  vagina). This screening involves a pelvic examination, including checking for microscopic changes to the surface of your cervix (Pap test). You may be encouraged to have this screening done every 3 years, beginning at age 22.  For women ages 56-65, health care providers may recommend pelvic exams and Pap testing every 3 years, or they may recommend the Pap and pelvic exam, combined with testing for human papilloma virus (HPV), every 5 years. Some types of HPV increase your risk of cervical cancer. Testing for HPV may also be done on women of any age with unclear Pap test results.  Other health care providers may not recommend any screening for nonpregnant women who are considered low risk for pelvic cancer and who do not have symptoms. Ask your health care provider if a screening pelvic exam is right for you.  If you have had past treatment for cervical cancer or a condition that could lead to cancer, you need Pap tests and screening for cancer for at least 20 years after your treatment. If Pap tests have been discontinued, your risk factors (such as having a new sexual partner) need to be reassessed to determine if screening should resume. Some women have medical problems that increase the chance of getting cervical cancer. In these cases, your health care provider may recommend more frequent screening and Pap tests.  Colorectal Cancer  This type of cancer can be detected and often prevented.  Routine colorectal cancer screening usually begins at 59 years of age and continues through 59 years of age.  Your health care provider may recommend screening at an earlier age if you have risk factors for colon cancer.  Your health care provider may also recommend using home test kits to check for hidden blood in the stool.  A small camera at the end of a tube can be used to examine your colon directly (sigmoidoscopy or colonoscopy). This is done to check for the earliest forms of colorectal  cancer.  Routine screening usually begins at age 33.  Direct examination of the colon should be repeated every 5-10 years through 59 years of age. However, you may need to be screened more often if early forms of precancerous polyps or small growths are found.  Skin Cancer  Check your skin from head to toe regularly.  Tell your health care provider about any new moles or changes in moles, especially if there is a change in a mole's shape or color.  Also tell your health care provider if you have a mole that is larger than the size of a pencil eraser.  Always use sunscreen. Apply sunscreen liberally and repeatedly throughout the day.  Protect yourself by wearing long sleeves, pants, a wide-brimmed hat, and sunglasses whenever you are outside.  Heart disease, diabetes, and high blood pressure  High blood pressure causes heart disease and increases the risk of stroke. High blood pressure is more likely to develop in: ? People who have blood pressure in the high end of  the normal range (130-139/85-89 mm Hg). ? People who are overweight or obese. ? People who are African American.  If you are 21-29 years of age, have your blood pressure checked every 3-5 years. If you are 3 years of age or older, have your blood pressure checked every year. You should have your blood pressure measured twice-once when you are at a hospital or clinic, and once when you are not at a hospital or clinic. Record the average of the two measurements. To check your blood pressure when you are not at a hospital or clinic, you can use: ? An automated blood pressure machine at a pharmacy. ? A home blood pressure monitor.  If you are between 17 years and 37 years old, ask your health care provider if you should take aspirin to prevent strokes.  Have regular diabetes screenings. This involves taking a blood sample to check your fasting blood sugar level. ? If you are at a normal weight and have a low risk for diabetes,  have this test once every three years after 59 years of age. ? If you are overweight and have a high risk for diabetes, consider being tested at a younger age or more often. Preventing infection Hepatitis B  If you have a higher risk for hepatitis B, you should be screened for this virus. You are considered at high risk for hepatitis B if: ? You were born in a country where hepatitis B is common. Ask your health care provider which countries are considered high risk. ? Your parents were born in a high-risk country, and you have not been immunized against hepatitis B (hepatitis B vaccine). ? You have HIV or AIDS. ? You use needles to inject street drugs. ? You live with someone who has hepatitis B. ? You have had sex with someone who has hepatitis B. ? You get hemodialysis treatment. ? You take certain medicines for conditions, including cancer, organ transplantation, and autoimmune conditions.  Hepatitis C  Blood testing is recommended for: ? Everyone born from [birth year] through 1963. ? Anyone with known risk factors for hepatitis C.  Sexually transmitted infections (STIs)  You should be screened for sexually transmitted infections (STIs) including gonorrhea and chlamydia if: ? You are sexually active and are younger than 59 years of age. ? You are older than 59 years of age and your health care provider tells you that you are at risk for this type of infection. ? Your sexual activity has changed since you were last screened and you are at an increased risk for chlamydia or gonorrhea. Ask your health care provider if you are at risk.  If you do not have HIV, but are at risk, it may be recommended that you take a prescription medicine daily to prevent HIV infection. This is called pre-exposure prophylaxis (PrEP). You are considered at risk if: ? You are sexually active and do not regularly use condoms or know the HIV status of your partner(s). ? You take drugs by injection. ? You are  sexually active with a partner who has HIV.  Talk with your health care provider about whether you are at high risk of being infected with HIV. If you choose to begin PrEP, you should first be tested for HIV. You should then be tested every 3 months for as long as you are taking PrEP. Pregnancy  If you are premenopausal and you may become pregnant, ask your health care provider about preconception counseling.  If you may become  pregnant, take 400 to 800 micrograms (mcg) of folic acid every day.  If you want to prevent pregnancy, talk to your health care provider about birth control (contraception). Osteoporosis and menopause  Osteoporosis is a disease in which the bones lose minerals and strength with aging. This can result in serious bone fractures. Your risk for osteoporosis can be identified using a bone density scan.  If you are 74 years of age or older, or if you are at risk for osteoporosis and fractures, ask your health care provider if you should be screened.  Ask your health care provider whether you should take a calcium or vitamin D supplement to lower your risk for osteoporosis.  Menopause may have certain physical symptoms and risks.  Hormone replacement therapy may reduce some of these symptoms and risks. Talk to your health care provider about whether hormone replacement therapy is right for you. Follow these instructions at home:  Schedule regular health, dental, and eye exams.  Stay current with your immunizations.  Do not use any tobacco products including cigarettes, chewing tobacco, or electronic cigarettes.  If you are pregnant, do not drink alcohol.  If you are breastfeeding, limit how much and how often you drink alcohol.  Limit alcohol intake to no more than 1 drink per day for nonpregnant women. One drink equals 12 ounces of beer, 5 ounces of wine, or 1 ounces of hard liquor.  Do not use street drugs.  Do not share needles.  Ask your health care  provider for help if you need support or information about quitting drugs.  Tell your health care provider if you often feel depressed.  Tell your health care provider if you have ever been abused or do not feel safe at home. This information is not intended to replace advice given to you by your health care provider. Make sure you discuss any questions you have with your health care provider. Document Released: 05/27/2011 Document Revised: 04/18/2016 Document Reviewed: 08/15/2015 Elsevier Interactive Patient Education  2018 [employer].     IF you received an x-ray today, you will receive an invoice from [facility]. Please contact [facility] at [phone] with questions or concerns regarding your invoice.   IF you received labwork today, you will receive an invoice from [facility]. Please contact [facility] at [phone] with questions or concerns regarding your invoice.   Our billing staff will not be able to assist you with questions regarding bills from these companies.  You will be contacted with the lab results as soon as they are available. The fastest way to get your results is to activate your My Chart account. Instructions are located on the last page of this paperwork. If you have not heard from [place] regarding the results in 2 weeks, please contact this office.

## 2017-11-24 DIAGNOSIS — Z01419 Encounter for gynecological examination (general) (routine) without abnormal findings: Secondary | ICD-10-CM | POA: Diagnosis not present

## 2017-11-24 DIAGNOSIS — Z6836 Body mass index (BMI) 36.0-36.9, adult: Secondary | ICD-10-CM | POA: Diagnosis not present

## 2017-11-24 DIAGNOSIS — Z1231 Encounter for screening mammogram for malignant neoplasm of breast: Secondary | ICD-10-CM | POA: Diagnosis not present

## 2017-12-01 ENCOUNTER — Telehealth: Payer: Self-pay | Admitting: Internal Medicine

## 2017-12-01 NOTE — Telephone Encounter (Signed)
I left a detailed message for the patient that the Humira was approved as of today and the reference number is [id number] from 11/28/17- 11/24/38.

## 2017-12-03 ENCOUNTER — Other Ambulatory Visit: Payer: Self-pay

## 2017-12-03 MED ORDER — ADALIMUMAB 40 MG/0.8ML ~~LOC~~ AJKT: 1.0000 "pen " | AUTO-INJECTOR | SUBCUTANEOUS | 1 refills | Status: DC

## 2017-12-03 NOTE — Telephone Encounter (Signed)
Prescription transmitted to [place] [facility]

## 2017-12-03 NOTE — Telephone Encounter (Signed)
Patient states [facility] is needing a phone call or a fax directly from [place] for patient to get Humira refilled.

## 2017-12-04 ENCOUNTER — Other Ambulatory Visit (INDEPENDENT_AMBULATORY_CARE_PROVIDER_SITE_OTHER): Payer: BLUE CROSS/BLUE SHIELD

## 2017-12-04 ENCOUNTER — Encounter: Payer: Self-pay | Admitting: Nurse Practitioner

## 2017-12-04 ENCOUNTER — Ambulatory Visit (INDEPENDENT_AMBULATORY_CARE_PROVIDER_SITE_OTHER): Payer: BLUE CROSS/BLUE SHIELD | Admitting: Nurse Practitioner

## 2017-12-04 VITALS — BP 142/78 | HR 68 | Ht 63.0 in | Wt 204.0 lb

## 2017-12-04 DIAGNOSIS — R109 Unspecified abdominal pain: Secondary | ICD-10-CM

## 2017-12-04 DIAGNOSIS — K6289 Other specified diseases of anus and rectum: Secondary | ICD-10-CM

## 2017-12-04 DIAGNOSIS — K50119 Crohn's disease of large intestine with unspecified complications: Secondary | ICD-10-CM

## 2017-12-04 LAB — CBC
HCT: 39.5 % (ref 36.0–46.0)
Hemoglobin: 13.2 g/dL (ref 12.0–15.0)
MCHC: 33.4 g/dL (ref 30.0–36.0)
MCV: 87.6 fl (ref 78.0–100.0)
PLATELETS: 276 10*3/uL (ref 150.0–400.0)
RBC: 4.51 Mil/uL (ref 3.87–5.11)
RDW: 12.9 % (ref 11.5–15.5)
WBC: 6.2 10*3/uL (ref 4.0–10.5)

## 2017-12-04 LAB — SEDIMENTATION RATE: SED RATE: 44 mm/h — AB (ref 0–30)

## 2017-12-04 LAB — HIGH SENSITIVITY CRP: CRP HIGH SENSITIVITY: 1.97 mg/L (ref 0.000–5.000)

## 2017-12-04 MED ORDER — HYDROCORTISONE 2.5 % RE CREA: 1.0000 "application " | TOPICAL_CREAM | Freq: Every day | RECTAL | 0 refills | Status: DC

## 2017-12-04 NOTE — Patient Instructions (Addendum)
If you are age 60 or older, your body mass index should be between 23-30. Your Body mass index is 36.14 kg/m. If this is out of the aforementioned range listed, please consider follow up with your Primary Care Provider.  If you are age 72 or younger, your body mass index should be between 19-25. Your Body mass index is 36.14 kg/m. If this is out of the aformentioned range listed, please consider follow up with your Primary Care Provider.   Your physician has requested that you go to the basement for lab work before leaving today.  We have sent the following medications to your pharmacy for you to pick up at your convenience: Anusol cream  Follow up with Dr Henrene Pastor on January 23, 2018 at 10:45 a.m.  Thank you for choosing me and [facility].   Tye Savoy, NP

## 2017-12-04 NOTE — Progress Notes (Signed)
     Chief Complaint:   Abdominal pain  HPI: Patient is a 62 yo female known to Dr. Perry for long-standing history of Crohn's colitis , maintained on Humira. She had a symptomatic distal transverse colon stricture found at time of last colonoscopy January 2016, s/p segmental colonic resection with Dr. Hoxworth March 2016.  Was last seen here October 2017.   Patient is here with abdominal discomfort but also rectal discomfort. Abdominal discomfort is really more of a "twinge type" pain at surgical site and happens from time to time. Main discomfort is a constant dull rectal pain. Her BMs are at baseline. No rectal bleeding. No fevers.  She doesn't really feel like this is a Crohn's flare.   Past Medical History:  Diagnosis Date  . Abnormal LFTs severaal years agio, medication related  . Anemia   . Breast mass, right 08/24/2013   Excised 11/10/13. 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 Contact lens/glasses fitting    wears contacts or glasses  . Crohn disease ([facility])   . Diverticulosis     Patient's surgical history, family medical history, social history, medications and allergies were all reviewed in Epic    Physical Exam: BP (!) 142/78   Pulse 68   Ht 5' 3" (1.6 m)   Wt 204 lb (92.5 kg)   SpO2 98%   BMI 36.14 kg/m   GENERAL: Pleasant overweight white female in NAD PSYCH: : cooperative, normal affect EENT:  conjunctiva pink, mucous membranes moist, neck supple without masses CARDIAC:  RRR, no murmur heard, no peripheral edema PULM: Normal respiratory effort, lungs CTA bilaterally, no wheezing ABDOMEN:  Nondistended, soft, nontender. No obvious masses, no hepatomegaly,  normal bowel sounds RECTAL: no external lesions. No fissures. Internal hemorrhoids on anoscopy SKIN:  turgor, no lesions seen Musculoskeletal:  Normal muscle tone, normal strength NEURO: Alert and oriented x 3, no focal neurologic deficits   ASSESSMENT and PLAN:  Pleasant 62 year old female with  longstanding Crohn's colitis, maintained on Humira. Unable to tolerate immune modulators secondary to hepatotoxicity. Here with dull discomfort in rectum. BMs are at baseline. No rectal bleeding. No abdominal pain, just "twinges" at surgical sites. She has internal hemorrhoids on exam today -trial of anusol cream Q HS x 7 days -CBC, CRP, ESR -she is overdue for routine follow up with Dr. Perry. Will arrange follow up with in a couple of months.     , NP 12/04/2017, 10:28 AM  

## 2017-12-09 ENCOUNTER — Encounter: Payer: Self-pay | Admitting: Nurse Practitioner

## 2017-12-09 NOTE — Progress Notes (Signed)
Reviewed assessment and plans as outlined. Yes, she needs to follow-up with me as recommended. Thanks

## 2018-01-23 ENCOUNTER — Ambulatory Visit: Payer: BLUE CROSS/BLUE SHIELD | Admitting: Internal Medicine

## 2018-03-24 ENCOUNTER — Ambulatory Visit (INDEPENDENT_AMBULATORY_CARE_PROVIDER_SITE_OTHER): Payer: BLUE CROSS/BLUE SHIELD | Admitting: Internal Medicine

## 2018-03-24 ENCOUNTER — Other Ambulatory Visit: Payer: BLUE CROSS/BLUE SHIELD

## 2018-03-24 ENCOUNTER — Encounter: Payer: Self-pay | Admitting: Internal Medicine

## 2018-03-24 VITALS — BP 116/74 | HR 88 | Ht 62.6 in | Wt 201.5 lb

## 2018-03-24 DIAGNOSIS — R109 Unspecified abdominal pain: Secondary | ICD-10-CM | POA: Diagnosis not present

## 2018-03-24 DIAGNOSIS — K50919 Crohn's disease, unspecified, with unspecified complications: Secondary | ICD-10-CM

## 2018-03-24 MED ORDER — ADALIMUMAB 40 MG/0.8ML ~~LOC~~ AJKT: 1.0000 "pen " | AUTO-INJECTOR | SUBCUTANEOUS | 1 refills | Status: DC

## 2018-03-24 NOTE — Patient Instructions (Signed)
Your provider has requested that you go to the basement level for lab work before leaving today. Press "B" on the elevator. The lab is located at the first door on the left as you exit the elevator.  We have sent the following medications to your pharmacy for you to pick up at your convenience:  Humira  You have been scheduled for a colonoscopy. Please follow written instructions given to you at your visit today.  Please pick up your prep supplies at the pharmacy within the next 1-3 days. If you use inhalers (even only as needed), please bring them with you on the day of your procedure. Your physician has requested that you go to www.startemmi.com and enter the access code given to you at your visit today. This web site gives a general overview about your procedure. However, you should still follow specific instructions given to you by our office regarding your preparation for the procedure.   Please follow up in one year

## 2018-03-24 NOTE — Progress Notes (Signed)
HISTORY OF PRESENT ILLNESS:  [name] [name] is a 62 y.o. female with long-standing Crohn's colitis with variable patient compliance, ineffective immunomodulatory therapy with side effects of hepatotoxicity, on Humira for a little over 3 years, symptomatic distal transverse colon stricture diagnosed on imaging and colonoscopy January 2016 status post segmental colonic resection with Dr. Excell Seltzer in March 2016. Patient was last seen October 2017. See that dictation. She was continued on Humira 40 mg every 2 weeks. Follow-up in 1 year recommended. Sooner if needed. She was seen by the GI physician assistant January 2019 with complaints of rectal discomfort and fleeting abdominal discomfort. See that dictation. Laboratories were unremarkable including CBC and C-reactive protein. She was treated with Anusol. Symptoms resolved without recurrence. She presents today for scheduled follow-up. Patient tells me that she has had some intermittent lower abdominal discomfort, left greater than right the past 4-6 months. The discomfort comes and goes. May last several days. No associated nausea or vomiting. No rectal bleeding. Bowel habits have been fairly regular for the most part. She wonders if her abdominal complaints may be related to inflammation. She is tolerating her medication well without appreciable side effects.  REVIEW OF SYSTEMS:  All non-GI ROS negative unless otherwise stated in the history of present illness except for skin rash in her lower extremities for which she has seen a dermatologist and given a questionable diagnosis of psoriasis. Does well on topical cream  Past Medical History:  Diagnosis Date  . Abnormal LFTs severaal years agio, medication related  . Anemia   . Breast mass, right 08/24/2013   Excised 11/10/13. 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 Contact lens/glasses fitting    wears contacts or glasses  . Crohn disease ([place])   . Diverticulosis     Past Surgical History:   Procedure Laterality Date  . ABDOMINAL HYSTERECTOMY  2003   lt SO-rt ovarian cysy  . adnoids removed    . BREAST LUMPECTOMY WITH NEEDLE LOCALIZATION Right 11/10/2013   Procedure: RIGHT BREAST MASS NEEDLE LOCALIZATION;  Surgeon: [name], MD;  Location: [facility];  Service: General;  Laterality: Right;  . BREAST SURGERY    . LAPAROSCOPIC PARTIAL COLECTOMY N/A 02/08/2015   Procedure: LAPAROSCOPIC PARTIAL COLECTOMY;  Surgeon: Excell Seltzer, MD;  Location: [facility];  Service: General;  Laterality: N/A;    Social History [name] [name]  reports that she quit smoking about 29 years ago. Her smoking use included cigarettes. She has a 20.00 pack-year smoking history. She has never used smokeless tobacco. She reports that she drinks about 1.2 - 1.8 oz of alcohol per week. She reports that she does not use drugs.  family history includes Breast cancer in her paternal aunt; Cancer in her father; Diabetes in her brother, maternal grandfather, and mother; Heart disease in her maternal grandfather and paternal grandfather; Lung cancer in her father; Stomach cancer in her maternal aunt.  No Known Allergies     PHYSICAL EXAMINATION: Vital signs: BP 116/74 (BP Location: Left Arm, Patient Position: Sitting, Cuff Size: Normal)   Pulse 88   Ht 5' 2.6" (1.59 m) Comment: height measured without shoes  Wt 201 lb 8 oz (91.4 kg)   BMI 36.15 kg/m   Constitutional: generally well-appearing, no acute distress Psychiatric: alert and oriented x3, cooperative Eyes: extraocular movements intact, anicteric, conjunctiva pink Mouth: oral pharynx moist, no lesions Neck: supple no lymphadenopathy Cardiovascular: heart regular rate and rhythm, no murmur Lungs: clear to auscultation bilaterally Abdomen:  soft, nontender, nondistended, no obvious ascites, no peritoneal signs, normal bowel sounds, no organomegaly Rectal:deferred until colonoscopy Extremities: no clubbing, cyanosis, or lower  extremity edema bilaterally Skin: no lesions on visible extremities Neuro: No focal deficits. Cranial nerves intact  ASSESSMENT:  #1. Crohn's colitis on Humira. Prior colonic resection for distal transverse colon stricture. Intermittent lower abdominal discomfort as described   PLAN:  #1. QuantiFERON Gold today #2. Continue Humira 40 mg every 2 weeks #3. Schedule colonoscopy to assess Crohn's disease activity and assess the efficacy of current Humira therapy.The nature of the procedure, as well as the risks, benefits, and alternatives were carefully and thoroughly reviewed with the patient. Ample time for discussion and questions allowed. The patient understood, was satisfied, and agreed to proceed. #4. Continue with her dermatologist regarding intermittent skin issues  25 minutes spent face-to-face with the patient. Greater than 50% a time use for counseling regarding her Crohn's disease, and therapeutic recommendations, and surveillance plans

## 2018-03-25 ENCOUNTER — Telehealth: Payer: Self-pay | Admitting: Internal Medicine

## 2018-03-25 ENCOUNTER — Other Ambulatory Visit: Payer: Self-pay

## 2018-03-25 MED ORDER — ADALIMUMAB 40 MG/0.8ML ~~LOC~~ AJKT: 1.0000 "pen " | AUTO-INJECTOR | SUBCUTANEOUS | 11 refills | Status: DC

## 2018-03-25 NOTE — Telephone Encounter (Signed)
Prescription sent to pharmacy per pt request.

## 2018-03-26 LAB — QUANTIFERON-TB GOLD PLUS
MITOGEN-NIL: 7.32 [IU]/mL
NIL: 0.07 IU/mL
QuantiFERON-TB Gold Plus: NEGATIVE
TB2-NIL: 0.13 [IU]/mL

## 2018-04-07 ENCOUNTER — Other Ambulatory Visit: Payer: Self-pay | Admitting: Internal Medicine

## 2018-05-14 ENCOUNTER — Telehealth: Payer: Self-pay | Admitting: Internal Medicine

## 2018-05-14 MED ORDER — NA SULFATE-K SULFATE-MG SULF 17.5-3.13-1.6 GM/177ML PO SOLN: 1.0000 | Freq: Once | ORAL | 0 refills | Status: AC

## 2018-05-14 NOTE — Telephone Encounter (Signed)
Prep sent

## 2018-05-25 ENCOUNTER — Encounter: Payer: Self-pay | Admitting: Internal Medicine

## 2018-06-02 ENCOUNTER — Encounter: Payer: Self-pay | Admitting: Internal Medicine

## 2018-06-02 ENCOUNTER — Ambulatory Visit (AMBULATORY_SURGERY_CENTER): Payer: BLUE CROSS/BLUE SHIELD | Admitting: Internal Medicine

## 2018-06-02 VITALS — BP 102/69 | HR 50 | Temp 98.4°F | Resp 14 | Ht 62.0 in | Wt 201.0 lb

## 2018-06-02 DIAGNOSIS — Z8719 Personal history of other diseases of the digestive system: Secondary | ICD-10-CM

## 2018-06-02 DIAGNOSIS — K50919 Crohn's disease, unspecified, with unspecified complications: Secondary | ICD-10-CM

## 2018-06-02 DIAGNOSIS — Z1211 Encounter for screening for malignant neoplasm of colon: Secondary | ICD-10-CM

## 2018-06-02 MED ORDER — SODIUM CHLORIDE 0.9 % IV SOLN: 500.0000 mL | Freq: Once | INTRAVENOUS | Status: DC

## 2018-06-02 NOTE — Patient Instructions (Signed)
Please read handout on diverticulosis. Please follow up with Dr. Henrene Pastor in one year if doing well. Sooner if needed.    YOU HAD AN ENDOSCOPIC PROCEDURE TODAY AT [facility]:   Refer to the procedure report that was given to you for any specific questions about what was found during the examination.  If the procedure report does not answer your questions, please call your gastroenterologist to clarify.  If you requested that your care partner not be given the details of your procedure findings, then the procedure report has been included in a sealed envelope for you to review at your convenience later.  YOU SHOULD EXPECT: Some feelings of bloating in the abdomen. Passage of more gas than usual.  Walking can help get rid of the air that was put into your GI tract during the procedure and reduce the bloating. If you had a lower endoscopy (such as a colonoscopy or flexible sigmoidoscopy) you may notice spotting of blood in your stool or on the toilet paper. If you underwent a bowel prep for your procedure, you may not have a normal bowel movement for a few days.  Please Note:  You might notice some irritation and congestion in your nose or some drainage.  This is from the oxygen used during your procedure.  There is no need for concern and it should clear up in a day or so.  SYMPTOMS TO REPORT IMMEDIATELY:   Following lower endoscopy (colonoscopy or flexible sigmoidoscopy):  Excessive amounts of blood in the stool  Significant tenderness or worsening of abdominal pains  Swelling of the abdomen that is new, acute  Fever of 100F or higher    For urgent or emergent issues, a gastroenterologist can be reached at any hour by calling [phone].   DIET:  We do recommend a small meal at first, but then you may proceed to your regular diet.  Drink plenty of fluids but you should avoid alcoholic beverages for 24 hours.  ACTIVITY:  You should plan to take it easy for the rest of  today and you should NOT DRIVE or use heavy machinery until tomorrow (because of the sedation medicines used during the test).    FOLLOW UP: Our staff will call the number listed on your records the next business day following your procedure to check on you and address any questions or concerns that you may have regarding the information given to you following your procedure. If we do not reach you, we will leave a message.  However, if you are feeling well and you are not experiencing any problems, there is no need to return our call.  We will assume that you have returned to your regular daily activities without incident.  If any biopsies were taken you will be contacted by phone or by letter within the next 1-3 weeks.  Please call us at [phone] if you have not heard about the biopsies in 3 weeks.    SIGNATURES/CONFIDENTIALITY: You and/or your care partner have signed paperwork which will be entered into your electronic medical record.  These signatures attest to the fact that that the information above on your After Visit Summary has been reviewed and is understood.  Full responsibility of the confidentiality of this discharge information lies with you and/or your care-partner.

## 2018-06-02 NOTE — Op Note (Signed)
[facility] Patient Name: [name] [name] Procedure Date: 06/02/2018 10:56 AM MRN: [id number] Endoscopist: Docia Chuck. Henrene Pastor , MD Age: 62 Referring MD:  Date of Birth: 07-16-1956 Gender: Female Account #: [id number] Procedure:                Colonoscopy Indications:              High risk colon cancer surveillance: Crohn's                            disease large intestine. Has been on Humira over 3                            years. Underwent segmental colonic resection in                            January 2016 for stricture Medicines:                Monitored Anesthesia Care Procedure:                Pre-Anesthesia Assessment:                           - Prior to the procedure, a History and Physical                            was performed, and patient medications and                            allergies were reviewed. The patient's tolerance of                            previous anesthesia was also reviewed. The risks                            and benefits of the procedure and the sedation                            options and risks were discussed with the patient.                            All questions were answered, and informed consent                            was obtained. Prior Anticoagulants: The patient has                            taken no previous anticoagulant or antiplatelet                            agents. ASA Grade Assessment: II - A patient with                            mild systemic disease. After reviewing the risks  and benefits, the patient was deemed in                            satisfactory condition to undergo the procedure.                           After obtaining informed consent, the colonoscope                            was passed under direct vision. Throughout the                            procedure, the patient's blood pressure, pulse, and                            oxygen saturations were monitored  continuously. The                            Model PCF-[id number] scope was introduced                            through the anus and advanced to the the cecum,                            identified by appendiceal orifice and ileocecal                            valve. The terminal ileum, ileocecal valve,                            appendiceal orifice, and rectum were photographed.                            The quality of the bowel preparation was excellent.                            The colonoscopy was performed without difficulty.                            The patient tolerated the procedure well. The bowel                            preparation used was SUPREP. Scope In: 11:04:34 AM Scope Out: 11:13:14 AM Scope Withdrawal Time: 0 hours 6 minutes 34 seconds  Total Procedure Duration: 0 hours 8 minutes 40 seconds  Findings:                 The terminal ileum appeared normal.                           A few diverticula were found in the sigmoid colon.                           The exam was otherwise without abnormality on  direct and retroflexion views. Status post prior                            segmental colonic resection. Complications:            No immediate complications. Estimated blood loss:                            None. Estimated Blood Loss:     Estimated blood loss: none. Impression:               - The examined portion of the ileum was normal.                           - Diverticulosis in the sigmoid colon.                           - The examination was otherwise normal on direct                            and retroflexion views, Post-segmental colonic                            resection.                           - No specimens collected. Recommendation:           - Repeat colonoscopy in 5 years for surveillance.                           - Patient has a contact number available for                            emergencies. The  signs and symptoms of potential                            delayed complications were discussed with the                            patient. Return to normal activities tomorrow.                            Written discharge instructions were provided to the                            patient.                           - Resume previous diet.                           - Continue present medications.                           - Office visit with Dr. Henrene Pastor in 1 year if doing  well. Sooner if needed Docia Chuck. Henrene Pastor, MD 06/02/2018 11:19:25 AM This report has been signed electronically.

## 2018-06-02 NOTE — Progress Notes (Signed)
Report to RN, VSS, adequate respirations noted, no c/o pain or discomfort

## 2018-06-02 NOTE — Progress Notes (Signed)
I have reviewed the patient's medical history in detail and updated the computerized patient record.

## 2018-06-03 ENCOUNTER — Telehealth: Payer: Self-pay | Admitting: *Deleted

## 2018-06-03 NOTE — Telephone Encounter (Signed)
  Follow up Call-  Call back number 06/02/2018  Post procedure Call Back phone  # [phone]  Permission to leave phone message Yes  Some recent data might be hidden     Patient questions:  Do you have a fever, pain , or abdominal swelling? No. Pain Score  0 *  Have you tolerated food without any problems? Yes.    Have you been able to return to your normal activities? Yes.    Do you have any questions about your discharge instructions: Diet   No. Medications  No. Follow up visit  No.  Do you have questions or concerns about your Care? No.  Actions: * If pain score is 4 or above: No action needed, pain <4.

## 2018-07-11 ENCOUNTER — Other Ambulatory Visit: Payer: Self-pay | Admitting: Internal Medicine

## 2018-07-13 ENCOUNTER — Telehealth: Payer: Self-pay | Admitting: Internal Medicine

## 2018-07-13 DIAGNOSIS — R1011 Right upper quadrant pain: Secondary | ICD-10-CM | POA: Diagnosis not present

## 2018-07-13 NOTE — Telephone Encounter (Signed)
Patient states she is has started have sharp pain on the RUQ of her abd. Patient had colon on 7.9.19, and is concerned that this may be connected to her procedure. Patient requesting to speak with nurse for advice.

## 2018-07-13 NOTE — Telephone Encounter (Signed)
Pt states that she has been having issues with RUQ abd pain that is under her ribs and radiates around to her back. Reports this has been going on for 2-3 months. States that it is even uncomfortable for her to lay on her right side at night. She also states that after she eats she has nausea at times. Pt saw her GYN because thought it may have been her breast area but was told by GYN to see GI. Pt scheduled to see Dr. Henrene Pastor 07/28/18@2 :45pm. Pt aware of appt.

## 2018-07-28 ENCOUNTER — Ambulatory Visit (INDEPENDENT_AMBULATORY_CARE_PROVIDER_SITE_OTHER): Payer: BLUE CROSS/BLUE SHIELD | Admitting: Internal Medicine

## 2018-07-28 ENCOUNTER — Encounter: Payer: Self-pay | Admitting: Internal Medicine

## 2018-07-28 VITALS — BP 110/74 | HR 90 | Ht 63.0 in | Wt 194.0 lb

## 2018-07-28 DIAGNOSIS — K50119 Crohn's disease of large intestine with unspecified complications: Secondary | ICD-10-CM

## 2018-07-28 DIAGNOSIS — R1011 Right upper quadrant pain: Secondary | ICD-10-CM

## 2018-07-28 NOTE — Progress Notes (Signed)
HISTORY OF PRESENT ILLNESS:  [name] [name] is a 62 y.o. female with long-standing Crohn's colitis with variable patient compliance, ineffective immodulatory therapy with side effects of hepatotoxicity, on Humira for over 3 years. Symptomatic distal transverse colon stricture diagnosed on imaging and colonoscopy January 2016 status post segmental resection Dr. Excell Seltzer March 2016. The patient was last seen in this office 03/24/2018 with complaints of intermittent lower abdominal discomfort. TB testing was negative. Surveillance colonoscopy was performed 06/02/2018. The examined ileum and colon were normal except for incidental sigmoid diverticulosis. No active Crohn's disease. Follow-up in 1 year recommended. Patient tells me that since late May she has been experiencing recurrent right upper quadrant discomfort underneath the right rib cage with radiation to the right side and right subscapular region. 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 She has also noticed that symptoms may be worse after a large meal and there is associated nausea but no vomiting. She does have the discomfort most days but the intensity is variable. No other complaints. She did see her gynecologist who ruled out any breast related disease as a cause. She does not take NSAIDs  REVIEW OF SYSTEMS:  All non-GI ROS negative except for skin rash  Past Medical History:  Diagnosis Date  . Abnormal LFTs severaal years agio, medication related  . Anemia   . Breast mass, right 08/24/2013   Excised 11/10/13. 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 Contact lens/glasses fitting    wears contacts or glasses  . Crohn disease ([place])   . Diverticulosis     Past Surgical History:  Procedure Laterality Date  . ABDOMINAL HYSTERECTOMY  2003   lt SO-rt ovarian cysy  . adnoids removed    . BREAST LUMPECTOMY WITH NEEDLE LOCALIZATION Right 11/10/2013   Procedure: RIGHT BREAST MASS NEEDLE LOCALIZATION;  Surgeon: [name], MD;  Location: [facility];  Service: General;  Laterality: Right;  . BREAST SURGERY    . LAPAROSCOPIC PARTIAL COLECTOMY N/A 02/08/2015   Procedure: LAPAROSCOPIC PARTIAL COLECTOMY;  Surgeon: Excell Seltzer, MD;  Location: [facility];  Service: General;  Laterality: N/A;    Social History [name]  reports that she quit smoking about 29 years ago. Her smoking use included cigarettes. She has a 20.00 pack-year smoking history. She has never used smokeless tobacco. She reports that she drinks about 2.0 - 3.0 standard drinks of alcohol per week. She reports that she does not use drugs.  family history includes Breast cancer in her paternal aunt; Cancer in her father; Diabetes in her brother, maternal grandfather, and mother; Heart disease in her maternal grandfather and paternal grandfather; Lung cancer in her father; Stomach cancer in her maternal aunt.  No Known Allergies     PHYSICAL EXAMINATION: Vital signs: BP 110/74   Pulse 90   Ht 5' 3"  (1.6 m)   Wt 194 lb (88 kg)   BMI 34.37 kg/m   Constitutional: generally well-appearing, no acute distress Psychiatric: alert and oriented x3, cooperative Eyes: extraocular movements intact, anicteric, conjunctiva pink Mouth: oral pharynx moist, no lesions Neck: supple no lymphadenopathy Cardiovascular: heart regular rate and rhythm, no murmur Lungs: clear to auscultation bilaterally Abdomen: soft, nontender, nondistended, no obvious ascites, no peritoneal signs, normal bowel sounds, no organomegaly. Palpation over the area of concern does not elicit discomfort Rectal:omitted Extremities: no clubbing, cyanosis, or lower extremity edema bilaterally Skin: no lesions on visible extremities  Neuro: No focal deficits. Cranial nerves intact  ASSESSMENT:  #1. Chronic right upper quadrant/side/subscapular discomfort of uncertain pathology. Question  musculoskeletal. Rule out gallbladder. Rule out ulcer disease #2. Crohn's colitis complicated by symptomatic stricture requiring segmental resection. The patient is in clinical and endoscopic remission on Humira   PLAN:  #1. Abdominal ultrasound #2. Upper endoscopy to evaluate pain.The nature of the procedure, as well as the risks, benefits, and alternatives were carefully and thoroughly reviewed with the patient. Ample time for discussion and questions allowed. The patient understood, was satisfied, and agreed to proceed. #3. Continue Humira #4. Follow-up to be determined after the above

## 2018-07-28 NOTE — Patient Instructions (Signed)
You have been scheduled for an abdominal ultrasound at [facility] (1st floor of hospital) on 07-29-18 at 8:00 am. Please arrive 15 minutes prior to your appointment for registration. Make certain not to have anything to eat or drink 6 hours prior to your appointment. Should you need to reschedule your appointment, please contact radiology at [phone]. This test typically takes about 30 minutes to perform.  You have been scheduled for an endoscopy. Please follow written instructions given to you at your visit today. If you use inhalers (even only as needed), please bring them with you on the day of your procedure. Your physician has requested that you go to www.startemmi.com and enter the access code given to you at your visit today. This web site gives a general overview about your procedure. However, you should still follow specific instructions given to you by our office regarding your preparation for the procedure.

## 2018-07-29 ENCOUNTER — Ambulatory Visit (HOSPITAL_COMMUNITY)
Admission: RE | Admit: 2018-07-29 | Discharge: 2018-07-29 | Disposition: A | Discharge status: Home / Self Care | Payer: BLUE CROSS/BLUE SHIELD | Source: Ambulatory Visit | Attending: Internal Medicine | Admitting: Internal Medicine

## 2018-07-29 ENCOUNTER — Ambulatory Visit (AMBULATORY_SURGERY_CENTER): Payer: BLUE CROSS/BLUE SHIELD | Admitting: Internal Medicine

## 2018-07-29 ENCOUNTER — Encounter: Payer: Self-pay | Admitting: Internal Medicine

## 2018-07-29 VITALS — BP 124/71 | HR 58 | Temp 98.2°F | Resp 19 | Ht 63.0 in | Wt 194.0 lb

## 2018-07-29 DIAGNOSIS — R932 Abnormal findings on diagnostic imaging of liver and biliary tract: Secondary | ICD-10-CM | POA: Diagnosis not present

## 2018-07-29 DIAGNOSIS — R1011 Right upper quadrant pain: Secondary | ICD-10-CM | POA: Diagnosis not present

## 2018-07-29 DIAGNOSIS — R1010 Upper abdominal pain, unspecified: Secondary | ICD-10-CM | POA: Diagnosis not present

## 2018-07-29 MED ORDER — SODIUM CHLORIDE 0.9 % IV SOLN: 500.0000 mL | Freq: Once | INTRAVENOUS | Status: DC

## 2018-07-29 NOTE — Progress Notes (Signed)
Pt's states no medical or surgical changes since previsit or office visit. 

## 2018-07-29 NOTE — Op Note (Signed)
[facility] Patient Name: [name] [name] Procedure Date: 07/29/2018 1:22 PM MRN: [id number] Endoscopist: Docia Chuck. Henrene Pastor , MD Age: 62 Referring MD:  Date of Birth: 1956-04-19 Gender: Female Account #: [id number] Procedure:                Upper GI endoscopy Indications:              Abdominal pain in the right upper quadrant Medicines:                Monitored Anesthesia Care Procedure:                Pre-Anesthesia Assessment:                           - Prior to the procedure, a History and Physical                            was performed, and patient medications and                            allergies were reviewed. The patient's tolerance of                            previous anesthesia was also reviewed. The risks                            and benefits of the procedure and the sedation                            options and risks were discussed with the patient.                            All questions were answered, and informed consent                            was obtained. Prior Anticoagulants: The patient has                            taken no previous anticoagulant or antiplatelet                            agents. ASA Grade Assessment: II - A patient with                            mild systemic disease. After reviewing the risks                            and benefits, the patient was deemed in                            satisfactory condition to undergo the procedure.                           After obtaining informed consent, the endoscope was  passed under direct vision. Throughout the                            procedure, the patient's blood pressure, pulse, and                            oxygen saturations were monitored continuously. The                            Model GIF-[id number]) scope was introduced                            through the mouth, and advanced to the fourth part                            of duodenum. The  upper GI endoscopy was                            accomplished without difficulty. The patient                            tolerated the procedure well. Scope In: Scope Out: Findings:                 The esophagus was normal.                           The stomach was normal.                           The examined duodenum was normal.                           The cardia and gastric fundus were normal on                            retroflexion. Complications:            No immediate complications. Estimated Blood Loss:     Estimated blood loss: none. Impression:               - Normal esophagus.                           - Normal stomach.                           - Normal examined duodenum.                           - No specimens collected. Recommendation:           - Patient has a contact number available for                            emergencies. The signs and symptoms of potential  delayed complications were discussed with the                            patient. Return to normal activities tomorrow.                            Written discharge instructions were provided to the                            patient.                           - Resume previous diet.                           - Continue present medications.                           - Schedule contrast-enhanced CT scan of the chest                            and abdomen "right upper quadrant right chest wall                            pain, persistent and progressive" John N. Henrene Pastor, MD 07/29/2018 1:50:24 PM This report has been signed electronically.

## 2018-07-29 NOTE — Progress Notes (Signed)
Report to PACU, RN, vss, BBS= Clear.  

## 2018-07-29 NOTE — Patient Instructions (Signed)
You have been scheduled for a CT scan of the abdomen and pelvis at [facility] ([address], this is in the same building as Press photographer across from [facility]).   You are scheduled on ___at___. You should arrive 15 minutes prior to your appointment time for registration. Please follow the written instructions below on the day of your exam:  WARNING: IF YOU ARE ALLERGIC TO IODINE/X-RAY DYE, PLEASE NOTIFY RADIOLOGY IMMEDIATELY AT [phone]! YOU WILL BE GIVEN A 13 HOUR PREMEDICATION PREP.  1) Do not eat or drink anything after ____ (4 hours prior to your test)  2) You have been given 2 bottles of oral contrast to drink. The solution may taste better if refrigerated, but do NOT add ice or any other liquid to this solution. Shake well before drinking.  Drink 1 bottle of contrast @ ____ (2 hours prior to your exam) Drink 1 bottle of contrast @ _____ (1 hour prior to your exam)   You may take any medications as prescribed with a small amount of water except for the following: Metformin, Glucophage, Glucovance, Avandamet, Riomet, Fortamet, Actoplus Met, Janumet, Glumetza or Metaglip. The above medications must be held the day of the exam AND 48 hours after the exam.  The purpose of you drinking the oral contrast is to aid in the visualization of your intestinal tract. The contrast solution may cause some diarrhea. Before your exam is started, you will be given a small amount of fluid to drink. Depending on your individual set of symptoms, you may also receive an intravenous injection of x-ray contrast/dye. Plan on being at [facility] for 30 minutes or long, depending on the type of exam you are having performed.  If you have any questions regarding your exam or if you need to reschedule, you may call the CT department at [phone] between the hours of 8:00 am and 5:00 pm, Monday-Friday.  YOU HAD AN ENDOSCOPIC PROCEDURE TODAY AT [facility]:    Refer to the procedure report that was given to you for any specific questions about what was found during the examination.  If the procedure report does not answer your questions, please call your gastroenterologist to clarify.  If you requested that your care partner not be given the details of your procedure findings, then the procedure report has been included in a sealed envelope for you to review at your convenience later.  YOU SHOULD EXPECT: Some feelings of bloating in the abdomen. Passage of more gas than usual.  Walking can help get rid of the air that was put into your GI tract during the procedure and reduce the bloating. If you had a lower endoscopy (such as a colonoscopy or flexible sigmoidoscopy) you may notice spotting of blood in your stool or on the toilet paper. If you underwent a bowel prep for your procedure, you may not have a normal bowel movement for a few days.  Please Note:  You might notice some irritation and congestion in your nose or some drainage.  This is from the oxygen used during your procedure.  There is no need for concern and it should clear up in a day or so.  SYMPTOMS TO REPORT IMMEDIATELY:     Following upper endoscopy (EGD)  Vomiting of blood or coffee ground material  New chest pain or pain under the shoulder blades  Painful or persistently difficult swallowing  New shortness of breath  Fever of 100F or higher  Black,  tarry-looking stools  For urgent or emergent issues, a gastroenterologist can be reached at any hour by calling [phone].   DIET:  We do recommend a small meal at first, but then you may proceed to your regular diet.  Drink plenty of fluids but you should avoid alcoholic beverages for 24 hours.  ACTIVITY:  You should plan to take it easy for the rest of today and you should NOT DRIVE or use heavy machinery until tomorrow (because of the sedation medicines used during the test).    FOLLOW UP: Our staff will call the number  listed on your records the next business day following your procedure to check on you and address any questions or concerns that you may have regarding the information given to you following your procedure. If we do not reach you, we will leave a message.  However, if you are feeling well and you are not experiencing any problems, there is no need to return our call.  We will assume that you have returned to your regular daily activities without incident.  If any biopsies were taken you will be contacted by phone or by letter within the next 1-3 weeks.  Please call us at [phone] if you have not heard about the biopsies in 3 weeks.    SIGNATURES/CONFIDENTIALITY: You and/or your care partner have signed paperwork which will be entered into your electronic medical record.  These signatures attest to the fact that that the information above on your After Visit Summary has been reviewed and is understood.  Full responsibility of the confidentiality of this discharge information lies with you and/or your care-partner.

## 2018-07-30 ENCOUNTER — Telehealth: Payer: Self-pay | Admitting: *Deleted

## 2018-07-30 NOTE — Telephone Encounter (Signed)
  Follow up Call-  Call back number 07/29/2018 06/02/2018  Post procedure Call Back phone  # [phone]  Permission to leave phone message Yes Yes  Some recent data might be hidden     Patient questions:  Do you have a fever, pain , or abdominal swelling? No. Pain Score  0 *  Have you tolerated food without any problems? Yes.    Have you been able to return to your normal activities? Yes.    Do you have any questions about your discharge instructions: Diet   No. Medications  No. Follow up visit  No.  Do you have questions or concerns about your Care? No.  Actions: * If pain score is 4 or above: No action needed, pain <4.

## 2018-08-03 ENCOUNTER — Other Ambulatory Visit: Payer: Self-pay

## 2018-08-03 ENCOUNTER — Telehealth: Payer: Self-pay

## 2018-08-03 DIAGNOSIS — R0789 Other chest pain: Secondary | ICD-10-CM

## 2018-08-03 NOTE — Telephone Encounter (Signed)
Pt to come for labs prior to CT this week, orders in epic. Pt scheduled for CT of Chest and Abdomen at [facility] located at [address] 08/12/18@1 :30pm, pt to arrive there at 1:15pm. Pt to be NPO after 9:30am, drink bottle 1 of contrast at 12:30pm. Pt aware of appt.

## 2018-08-11 ENCOUNTER — Other Ambulatory Visit (INDEPENDENT_AMBULATORY_CARE_PROVIDER_SITE_OTHER): Payer: BLUE CROSS/BLUE SHIELD

## 2018-08-11 DIAGNOSIS — R0789 Other chest pain: Secondary | ICD-10-CM

## 2018-08-11 LAB — BASIC METABOLIC PANEL
BUN: 20 mg/dL (ref 6–23)
CHLORIDE: 105 meq/L (ref 96–112)
CO2: 23 meq/L (ref 19–32)
Calcium: 9.4 mg/dL (ref 8.4–10.5)
Creatinine, Ser: 0.75 mg/dL (ref 0.40–1.20)
GFR: 83.61 mL/min (ref >60.00)
GLUCOSE: 105 mg/dL — AB (ref 70–99)
Potassium: 4.3 mEq/L (ref 3.5–5.1)
Sodium: 140 mEq/L (ref 135–145)

## 2018-08-12 ENCOUNTER — Ambulatory Visit (INDEPENDENT_AMBULATORY_CARE_PROVIDER_SITE_OTHER)
Admission: RE | Admit: 2018-08-12 | Discharge: 2018-08-12 | Disposition: A | Discharge status: Home / Self Care | Payer: BLUE CROSS/BLUE SHIELD | Source: Ambulatory Visit | Attending: Internal Medicine | Admitting: Internal Medicine

## 2018-08-12 DIAGNOSIS — R0789 Other chest pain: Secondary | ICD-10-CM | POA: Diagnosis not present

## 2018-08-12 DIAGNOSIS — R1011 Right upper quadrant pain: Secondary | ICD-10-CM | POA: Diagnosis not present

## 2018-08-12 MED ORDER — IOPAMIDOL (ISOVUE-300) INJECTION 61%
100.0000 mL | Freq: Once | INTRAVENOUS | Status: AC | PRN
  Administered 2018-08-12: 100 mL via INTRAVENOUS

## 2018-08-20 DIAGNOSIS — R0789 Other chest pain: Secondary | ICD-10-CM | POA: Diagnosis not present

## 2018-08-27 ENCOUNTER — Telehealth: Payer: Self-pay | Admitting: Internal Medicine

## 2018-08-27 NOTE — Telephone Encounter (Signed)
Patient states she is on humira and wants to know if she can get the flu vaccine while taking this medication.

## 2018-08-27 NOTE — Telephone Encounter (Signed)
Spoke with pt and she is aware.

## 2018-08-27 NOTE — Telephone Encounter (Signed)
As long as it is the inactivated vaccine (not the live attenuated vaccine).

## 2018-08-27 NOTE — Telephone Encounter (Signed)
Please see note below and advise  

## 2018-09-18 ENCOUNTER — Other Ambulatory Visit: Payer: Self-pay | Admitting: Internal Medicine

## 2018-11-23 DIAGNOSIS — M1712 Unilateral primary osteoarthritis, left knee: Secondary | ICD-10-CM | POA: Diagnosis not present

## 2018-11-23 DIAGNOSIS — M25561 Pain in right knee: Secondary | ICD-10-CM | POA: Diagnosis not present

## 2018-11-23 DIAGNOSIS — G8929 Other chronic pain: Secondary | ICD-10-CM | POA: Diagnosis not present

## 2018-11-23 DIAGNOSIS — M170 Bilateral primary osteoarthritis of knee: Secondary | ICD-10-CM | POA: Diagnosis not present

## 2018-11-24 DIAGNOSIS — Z6834 Body mass index (BMI) 34.0-34.9, adult: Secondary | ICD-10-CM | POA: Diagnosis not present

## 2018-11-24 DIAGNOSIS — Z13820 Encounter for screening for osteoporosis: Secondary | ICD-10-CM | POA: Diagnosis not present

## 2018-11-24 DIAGNOSIS — Z01419 Encounter for gynecological examination (general) (routine) without abnormal findings: Secondary | ICD-10-CM | POA: Diagnosis not present

## 2018-11-24 DIAGNOSIS — Z1231 Encounter for screening mammogram for malignant neoplasm of breast: Secondary | ICD-10-CM | POA: Diagnosis not present

## 2018-12-08 ENCOUNTER — Telehealth: Payer: Self-pay

## 2018-12-08 MED ORDER — ADALIMUMAB 40 MG/0.8ML ~~LOC~~ AJKT: 1.0000 "pen " | AUTO-INJECTOR | SUBCUTANEOUS | 2 refills | Status: DC

## 2018-12-08 NOTE — Telephone Encounter (Signed)
Refilled Humira

## 2018-12-16 ENCOUNTER — Other Ambulatory Visit (INDEPENDENT_AMBULATORY_CARE_PROVIDER_SITE_OTHER): Payer: BLUE CROSS/BLUE SHIELD

## 2018-12-16 ENCOUNTER — Encounter: Payer: Self-pay | Admitting: Family

## 2018-12-16 ENCOUNTER — Ambulatory Visit (INDEPENDENT_AMBULATORY_CARE_PROVIDER_SITE_OTHER): Payer: BLUE CROSS/BLUE SHIELD | Admitting: Family

## 2018-12-16 VITALS — BP 124/76 | HR 83 | Temp 98.4°F | Ht 63.0 in | Wt 192.0 lb

## 2018-12-16 DIAGNOSIS — R5383 Other fatigue: Secondary | ICD-10-CM | POA: Diagnosis not present

## 2018-12-16 DIAGNOSIS — Z13220 Encounter for screening for lipoid disorders: Secondary | ICD-10-CM

## 2018-12-16 DIAGNOSIS — E559 Vitamin D deficiency, unspecified: Secondary | ICD-10-CM

## 2018-12-16 DIAGNOSIS — K50119 Crohn's disease of large intestine with unspecified complications: Secondary | ICD-10-CM

## 2018-12-16 DIAGNOSIS — L409 Psoriasis, unspecified: Secondary | ICD-10-CM

## 2018-12-16 LAB — LIPID PANEL
CHOL/HDL RATIO: 4
CHOLESTEROL: 198 mg/dL (ref 0–200)
HDL: 46.3 mg/dL (ref >39.00)
LDL CALC: 132 mg/dL — AB (ref 0–99)
NONHDL: 152.18
Triglycerides: 99 mg/dL (ref 0.0–149.0)
VLDL: 19.8 mg/dL (ref 0.0–40.0)

## 2018-12-16 LAB — CBC WITH DIFFERENTIAL/PLATELET
Basophils Absolute: 0 10*3/uL (ref 0.0–0.1)
Basophils Relative: 0.5 % (ref 0.0–3.0)
EOS PCT: 4.7 % (ref 0.0–5.0)
Eosinophils Absolute: 0.2 10*3/uL (ref 0.0–0.7)
HCT: 37.5 % (ref 36.0–46.0)
Hemoglobin: 12.8 g/dL (ref 12.0–15.0)
LYMPHS ABS: 1.9 10*3/uL (ref 0.7–4.0)
Lymphocytes Relative: 38.2 % (ref 12.0–46.0)
MCHC: 34.2 g/dL (ref 30.0–36.0)
MCV: 86.3 fl (ref 78.0–100.0)
MONOS PCT: 5.7 % (ref 3.0–12.0)
Monocytes Absolute: 0.3 10*3/uL (ref 0.1–1.0)
NEUTROS ABS: 2.6 10*3/uL (ref 1.4–7.7)
NEUTROS PCT: 50.9 % (ref 43.0–77.0)
PLATELETS: 250 10*3/uL (ref 150.0–400.0)
RBC: 4.34 Mil/uL (ref 3.87–5.11)
RDW: 12.9 % (ref 11.5–15.5)
WBC: 5.1 10*3/uL (ref 4.0–10.5)

## 2018-12-16 LAB — COMPREHENSIVE METABOLIC PANEL
ALT: 18 U/L (ref 0–35)
AST: 17 U/L (ref 0–37)
Albumin: 4.2 g/dL (ref 3.5–5.2)
Alkaline Phosphatase: 50 U/L (ref 39–117)
BUN: 11 mg/dL (ref 6–23)
CHLORIDE: 105 meq/L (ref 96–112)
CO2: 29 meq/L (ref 19–32)
CREATININE: 0.79 mg/dL (ref 0.40–1.20)
Calcium: 9.6 mg/dL (ref 8.4–10.5)
GFR: 74 mL/min (ref >60.00)
GLUCOSE: 100 mg/dL — AB (ref 70–99)
POTASSIUM: 4.1 meq/L (ref 3.5–5.1)
SODIUM: 141 meq/L (ref 135–145)
Total Bilirubin: 0.5 mg/dL (ref 0.2–1.2)
Total Protein: 7.3 g/dL (ref 6.0–8.3)

## 2018-12-16 LAB — VITAMIN D 25 HYDROXY (VIT D DEFICIENCY, FRACTURES): VITD: 25.17 ng/mL — ABNORMAL LOW (ref 30.00–100.00)

## 2018-12-16 LAB — TSH: TSH: 1.29 u[IU]/mL (ref 0.35–4.50)

## 2018-12-16 NOTE — Progress Notes (Signed)
[name] [name] is a 63 y.o. female with the following history as recorded in EpicCare:  Patient Active Problem List   Diagnosis Date Noted  . Crohn's colitis ([place]) 02/08/2015  . DIARRHEA 01/03/2011  . ABNORMAL TRANSAMINASE, (LFT'S) 06/07/2009  . 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 CROHN'S DISEASE 03/08/2009    Current Outpatient Medications  Medication Sig Dispense Refill  . Adalimumab (HUMIRA PEN) 40 MG/0.8ML PNKT Inject 1 pen into the skin every 14 (fourteen) days. 2 each 2  . betamethasone acetate-betamethasone sodium phosphate (CELESTONE) 6 (3-3) MG/ML injection Inject into the articular space.    . calcium-vitamin D 250-100 MG-UNIT per tablet Take 1 tablet by mouth 2 (two) times daily.    . carboxymethylcellulose (REFRESH PLUS) 0.5 % SOLN Place 1 drop into both eyes 3 (three) times daily as needed (Dry eyes).    . Coenzyme Q10 (CO Q-10 PO) Take 1 tablet by mouth daily.    . Multiple Vitamin (MULTIVITAMIN) tablet Take 1 tablet by mouth daily.    . niacin 100 MG tablet Take 100 mg by mouth daily with breakfast.    . vitamin B-12 (CYANOCOBALAMIN) 100 MCG tablet Take 50 mcg by mouth daily.    . hydrocortisone (ANUSOL-HC) 2.5 % rectal cream Place 1 application rectally at bedtime. Intrarectally (Patient not taking: Reported on 12/16/2018) 30 g 0   Current Facility-Administered Medications  Medication Dose Route Frequency Provider Last Rate Last Dose  . 0.9 %  sodium chloride infusion  500 mL Intravenous Once Irene Shipper, MD      . 0.9 %  sodium chloride infusion  500 mL Intravenous Once Irene Shipper, MD      . pneumococcal 23 valent vaccine (PNU-IMMUNE) injection 0.5 mL  0.5 mL Intramuscular Tomorrow-1000 Irene Shipper, MD        Allergies: Patient has no known allergies.  Past Medical History:  Diagnosis Date  . Abnormal LFTs severaal years agio, medication related  . Anemia   . Breast mass, right 08/24/2013   Excised 11/10/13. Path complex sclerosing lesion,, no atypia    . Contact lens/glasses fitting    wears contacts or glasses  . Crohn disease ([place])   . Diverticulosis     Past Surgical History:  Procedure Laterality Date  . ABDOMINAL HYSTERECTOMY  2003   lt SO-rt ovarian cysy  . adnoids removed    . BREAST LUMPECTOMY WITH NEEDLE LOCALIZATION Right 11/10/2013   Procedure: RIGHT BREAST MASS NEEDLE LOCALIZATION;  Surgeon: [name], MD;  Location: [facility];  Service: General;  Laterality: Right;  . BREAST SURGERY    . LAPAROSCOPIC PARTIAL COLECTOMY N/A 02/08/2015   Procedure: LAPAROSCOPIC PARTIAL COLECTOMY;  Surgeon: Excell Seltzer, MD;  Location: [facility];  Service: General;  Laterality: N/A;    Family History  Problem Relation Age of Onset  . Lung cancer Father   . Cancer Father   . Diabetes Mother   . Diabetes Brother   . Breast cancer Paternal Aunt   . Stomach cancer Maternal Aunt   . Diabetes Maternal Grandfather   . Heart disease Maternal Grandfather   . Heart disease Paternal Grandfather   . Colon cancer Neg Hx   . Esophageal cancer Neg Hx   . Rectal cancer Neg Hx     Social History   Tobacco Use  . Smoking status: Former Smoker    Packs/day: 2.00    Years: 10.00    Pack years: 20.00    Types: Cigarettes  Last attempt to quit: 08/24/1988    Years since quitting: 30.3  . Smokeless tobacco: Never Used  Substance Use Topics  . Alcohol use: Yes    Alcohol/week: 2.0 - 3.0 standard drinks    Types: 2 - 3 Standard drinks or equivalent per week    Comment: social    Subjective:  Patient presents today as a new patient;  does have GYN- saw at end of December/ up to date on mammogram/ pap smear; history of Crohns- on maintenance with Humira/ under care of Dr. Henrene Pastor; Dr. 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  Head: Normocephalic and atraumatic  Eyes: Sclera and conjunctiva clear; pupils round and reactive to light; extraocular movements intact  Ears: External normal; canals clear; tympanic membranes normal  Oropharynx: Pink, supple. No suspicious lesions  Neck: Supple without thyromegaly, adenopathy  Lungs: Respirations unlabored; clear to auscultation bilaterally without wheeze, rales, rhonchi  CVS exam: normal rate and regular rhythm.  Neurologic: Alert and oriented; speech intact; face symmetrical; moves all extremities well; CNII-XII intact without focal deficit   Assessment:  1. Other fatigue   2. Lipid screening   3. Vitamin D deficiency   4. Crohn's disease of colon with complication ([place])   5. Psoriasis     Plan:  Labs updated as requested; continue with specialists as scheduled including GI and dermatology; will continue to see GYN yearly ( Dr. Corinna Capra at [facility]); need to determine safety of Tdap with Humira- follow-up to be determined. Follow-up in 1 year, sooner prn.  No follow-ups on file.  Orders Placed This Encounter  Procedures  . CBC w/Diff    Standing Status:   Future    Number of Occurrences:   1    Standing Expiration Date:   12/16/2019  . Comp Met (CMET)    Standing Status:   Future    Number of Occurrences:   1    Standing Expiration Date:   12/16/2019  . Lipid panel    Standing Status:   Future    Number of Occurrences:   1    Standing Expiration Date:   12/17/2019  . TSH    Standing Status:   Future    Number of Occurrences:   1    Standing Expiration Date:   12/16/2019  . Vitamin D (25 hydroxy)    Standing Status:   Future    Number of Occurrences:   1    Standing Expiration Date:   12/16/2019    Requested Prescriptions    No prescriptions requested or ordered in this encounter

## 2018-12-18 MED ORDER — ADALIMUMAB 40 MG/0.8ML ~~LOC~~ AJKT: 1.0000 "pen " | AUTO-INJECTOR | SUBCUTANEOUS | 2 refills | Status: DC

## 2018-12-18 NOTE — Telephone Encounter (Signed)
Resent Humira to [facility].

## 2018-12-18 NOTE — Telephone Encounter (Signed)
PT advised that we keep sending the Humira med to the wrong location and would like to get this updated. It needs to be sent to specialty [facility] specialty pharmacy, not local [facility].P: [phone]

## 2018-12-18 NOTE — Addendum Note (Signed)
Addended by: Audrea Muscat on: 12/18/2018 04:29 PM   Modules accepted: Orders

## 2018-12-21 NOTE — Telephone Encounter (Signed)
Pt requested a CB.  Pt reports that Humira is sent to the local pharmacy "every time"  and she would like to make sure it is sent to [facility] in the future.

## 2019-03-03 ENCOUNTER — Other Ambulatory Visit: Payer: Self-pay | Admitting: Internal Medicine

## 2019-03-03 NOTE — Telephone Encounter (Signed)
Last seen 07-2018. Last TB gold done on 03-24-2018. Can we refill? Please send to [facility].

## 2019-03-29 ENCOUNTER — Other Ambulatory Visit: Payer: Self-pay | Admitting: Internal Medicine

## 2019-04-19 ENCOUNTER — Other Ambulatory Visit: Payer: Self-pay | Admitting: Internal Medicine

## 2019-04-20 ENCOUNTER — Other Ambulatory Visit: Payer: Self-pay

## 2019-04-20 MED ORDER — ADALIMUMAB 40 MG/0.8ML ~~LOC~~ AJKT: 40.0000 mg | AUTO-INJECTOR | SUBCUTANEOUS | 2 refills | Status: DC

## 2019-06-29 ENCOUNTER — Encounter: Payer: Self-pay | Admitting: Internal Medicine

## 2019-07-05 ENCOUNTER — Other Ambulatory Visit: Payer: Self-pay | Admitting: Internal Medicine

## 2019-07-07 ENCOUNTER — Telehealth: Payer: Self-pay | Admitting: Internal Medicine

## 2019-07-07 MED ORDER — HUMIRA PEN 40 MG/0.8ML ~~LOC~~ PNKT: 40.0000 mg | PEN_INJECTOR | SUBCUTANEOUS | 1 refills | Status: DC

## 2019-07-07 NOTE — Telephone Encounter (Signed)
Prescription refilled.

## 2019-08-18 ENCOUNTER — Encounter: Payer: Self-pay | Admitting: Internal Medicine

## 2019-08-18 ENCOUNTER — Other Ambulatory Visit: Payer: Self-pay

## 2019-08-18 ENCOUNTER — Ambulatory Visit (INDEPENDENT_AMBULATORY_CARE_PROVIDER_SITE_OTHER): Payer: BC Managed Care – PPO | Admitting: Internal Medicine

## 2019-08-18 ENCOUNTER — Other Ambulatory Visit (INDEPENDENT_AMBULATORY_CARE_PROVIDER_SITE_OTHER): Payer: BC Managed Care – PPO

## 2019-08-18 VITALS — BP 120/76 | HR 64 | Temp 98.1°F | Ht 63.0 in | Wt 196.8 lb

## 2019-08-18 DIAGNOSIS — K5090 Crohn's disease, unspecified, without complications: Secondary | ICD-10-CM

## 2019-08-18 LAB — CBC WITH DIFFERENTIAL/PLATELET
Basophils Absolute: 0.1 10*3/uL (ref 0.0–0.1)
Basophils Relative: 0.8 % (ref 0.0–3.0)
Eosinophils Absolute: 0.2 10*3/uL (ref 0.0–0.7)
Eosinophils Relative: 2.8 % (ref 0.0–5.0)
HCT: 40.5 % (ref 36.0–46.0)
Hemoglobin: 13.7 g/dL (ref 12.0–15.0)
Lymphocytes Relative: 32.8 % (ref 12.0–46.0)
Lymphs Abs: 2.5 10*3/uL (ref 0.7–4.0)
MCHC: 33.9 g/dL (ref 30.0–36.0)
MCV: 86.4 fl (ref 78.0–100.0)
Monocytes Absolute: 0.5 10*3/uL (ref 0.1–1.0)
Monocytes Relative: 6.6 % (ref 3.0–12.0)
Neutro Abs: 4.3 10*3/uL (ref 1.4–7.7)
Neutrophils Relative %: 57 % (ref 43.0–77.0)
Platelets: 297 10*3/uL (ref 150.0–400.0)
RBC: 4.69 Mil/uL (ref 3.87–5.11)
RDW: 13.1 % (ref 11.5–15.5)
WBC: 7.6 10*3/uL (ref 4.0–10.5)

## 2019-08-18 LAB — COMPREHENSIVE METABOLIC PANEL
ALT: 28 U/L (ref 0–35)
AST: 22 U/L (ref 0–37)
Albumin: 4.5 g/dL (ref 3.5–5.2)
Alkaline Phosphatase: 57 U/L (ref 39–117)
BUN: 17 mg/dL (ref 6–23)
CO2: 25 mEq/L (ref 19–32)
Calcium: 10.1 mg/dL (ref 8.4–10.5)
Chloride: 101 mEq/L (ref 96–112)
Creatinine, Ser: 0.68 mg/dL (ref 0.40–1.20)
GFR: 87.79 mL/min (ref >60.00)
Glucose, Bld: 112 mg/dL — ABNORMAL HIGH (ref 70–99)
Potassium: 4.4 mEq/L (ref 3.5–5.1)
Sodium: 136 mEq/L (ref 135–145)
Total Bilirubin: 0.5 mg/dL (ref 0.2–1.2)
Total Protein: 7.9 g/dL (ref 6.0–8.3)

## 2019-08-18 LAB — HIGH SENSITIVITY CRP: CRP, High Sensitivity: 2.19 mg/L (ref 0.000–5.000)

## 2019-08-18 MED ORDER — HUMIRA PEN 40 MG/0.8ML ~~LOC~~ PNKT: 40.0000 mg | PEN_INJECTOR | SUBCUTANEOUS | 3 refills | Status: DC

## 2019-08-18 NOTE — Progress Notes (Signed)
HISTORY OF PRESENT ILLNESS:  [name] [name] is a 63 y.o. female with longstanding Crohn's colitis with variable patient compliance, ineffective immunomodulatory therapy with side effects of hepatotoxicity, on Humira for over 4 years.  Symptomatic distal transverse colon stricture diagnosed on imaging and colonoscopy January 2016 with subsequent segmental resection by Dr. Excell Seltzer March 2016.  Patient was last seen in this office July 28, 2018.  She remains on Humira 40 mg every 2 weeks.  She is pleased to report that she has had no clinical issues since her last visit.  At the time of her last visit she was having some abdominal pain for which upper endoscopy and abdominal ultrasound were ordered.  No significant abnormalities identified.  Her discomfort has resolved.  She also underwent CT of the chest abdomen which was negative.  Her last colonoscopy June 02, 2018 revealed sigmoid diverticulosis.  The examination, including the ileum was otherwise normal post segmental resection.  Repeat colonoscopy in 5 years recommended.  Last blood work December 16, 2018.  Unremarkable comprehensive metabolic panel.  Normal CBC with hemoglobin 12.8.  Slightly low vitamin D level at 25.  Patient tells me that she does have some transient discomfort at her Humira injection site about once per month.  This typically lasts less than 10 minutes.  REVIEW OF SYSTEMS:  All non-GI ROS negative unless otherwise stated in the HPI.  She does mention psoriasis which began post immunotherapy.  She does see a dermatologist  Past Medical History:  Diagnosis Date  . Abnormal LFTs severaal years agio, medication related  . Anemia   . Breast mass, right 08/24/2013   Excised 11/10/13. 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 Contact lens/glasses fitting    wears contacts or glasses  . Crohn disease ([place])   . Diverticulosis     Past Surgical History:  Procedure Laterality Date  . ABDOMINAL HYSTERECTOMY  2003   lt SO-rt  ovarian cysy  . adnoids removed    . BREAST LUMPECTOMY WITH NEEDLE LOCALIZATION Right 11/10/2013   Procedure: RIGHT BREAST MASS NEEDLE LOCALIZATION;  Surgeon: [name], MD;  Location: [facility];  Service: General;  Laterality: Right;  . BREAST SURGERY    . LAPAROSCOPIC PARTIAL COLECTOMY N/A 02/08/2015   Procedure: LAPAROSCOPIC PARTIAL COLECTOMY;  Surgeon: Excell Seltzer, MD;  Location: [facility];  Service: General;  Laterality: N/A;    Social History [name]  reports that she quit smoking about 31 years ago. Her smoking use included cigarettes. She has a 20.00 pack-year smoking history. She has never used smokeless tobacco. She reports current alcohol use of about 2.0 - 3.0 standard drinks of alcohol per week. She reports that she does not use drugs.  family history includes Breast cancer in her paternal aunt; Cancer in her father; Diabetes in her brother, maternal grandfather, and mother; Heart disease in her maternal grandfather and paternal grandfather; Lung cancer in her father; Stomach cancer in her maternal aunt.  No Known Allergies     PHYSICAL EXAMINATION: Vital signs: BP 120/76   Pulse 64   Temp 98.1 F (36.7 C) (Oral)   Ht 5' 3"  (1.6 m)   Wt 196 lb 12.8 oz (89.3 kg)   BMI 34.86 kg/m   Constitutional: generally well-appearing, no acute distress Psychiatric: alert and oriented x3, cooperative Eyes: extraocular movements intact, anicteric, conjunctiva pink Mouth: oral pharynx moist, no lesions Neck: supple no lymphadenopathy Cardiovascular: heart regular rate and rhythm, no murmur Lungs: clear  to auscultation bilaterally Abdomen: soft, nontender, nondistended, no obvious ascites, no peritoneal signs, normal bowel sounds, no organomegaly.  Surgical incision well-healed Rectal: Omitted Extremities: no clubbing, cyanosis, or lower extremity edema bilaterally Skin: no lesions on visible extremities except for a few small psoriatic plaques on the  legs Neuro: No focal deficits.  Cranial nerves intact  ASSESSMENT:  1.  Crohn's colitis complicated by symptomatic stricturing requiring segmental resection.  Patient remains in clinical and endoscopic remission on Humira.  Last colonoscopy July 2019. 2.  Occasional transient injection site discomfort with Humira.  Not uncommon.  Discussed 3.  Question Humira related mild psoriasis.  Being followed by dermatology  PLAN:  1.  Continue Humira 40 mg every 2 weeks.  Medication refilled.  Medication side effects reviewed 2.  Laboratories today including CBC, comprehensive metabolic panel, C-reactive protein, and QuantiFERON gold 3.  Recommend non-live influenza vaccine with PCP 4.  Routine follow-up colonoscopy around 2024 5.  Routine GI office follow-up 1 year.  Contact the office in the interim for any questions or problems 25-minute spent face-to-face with the patient.  Greater than 50% of the time used for counseling regarding her complicated Crohn's disease, medication management, and monitoring with follow-up plans.

## 2019-08-18 NOTE — Patient Instructions (Addendum)
We have sent the following medications to your pharmacy for you to pick up at your convenience:  Humira  Your provider has requested that you go to the basement level for lab work before leaving today. Press "B" on the elevator. The lab is located at the first door on the left as you exit the elevator.  Please follow up in one year

## 2019-08-20 LAB — QUANTIFERON-TB GOLD PLUS
Mitogen-NIL: >10 IU/mL
NIL: 0.17 IU/mL
QuantiFERON-TB Gold Plus: NEGATIVE
TB1-NIL: <0 IU/mL
TB2-NIL: <0 IU/mL

## 2019-08-29 ENCOUNTER — Other Ambulatory Visit: Payer: Self-pay | Admitting: Internal Medicine

## 2019-11-28 ENCOUNTER — Other Ambulatory Visit: Payer: Self-pay | Admitting: Internal Medicine

## 2019-12-29 DIAGNOSIS — L400 Psoriasis vulgaris: Secondary | ICD-10-CM | POA: Diagnosis not present

## 2019-12-29 DIAGNOSIS — L821 Other seborrheic keratosis: Secondary | ICD-10-CM | POA: Diagnosis not present

## 2019-12-29 DIAGNOSIS — D2362 Other benign neoplasm of skin of left upper limb, including shoulder: Secondary | ICD-10-CM | POA: Diagnosis not present

## 2019-12-29 DIAGNOSIS — L820 Inflamed seborrheic keratosis: Secondary | ICD-10-CM | POA: Diagnosis not present

## 2019-12-29 DIAGNOSIS — D225 Melanocytic nevi of trunk: Secondary | ICD-10-CM | POA: Diagnosis not present

## 2020-01-10 IMAGING — US US ABDOMEN COMPLETE
1 series · 13 of 25 positions shown · non-contrast
Comparison: None.

CLINICAL DATA: Upper abdominal pain

EXAM:
ABDOMEN ULTRASOUND COMPLETE

[Series 1: us abdomen complete · 0.22mm/px · 13 of 92 slices shown]
[im 1/92]
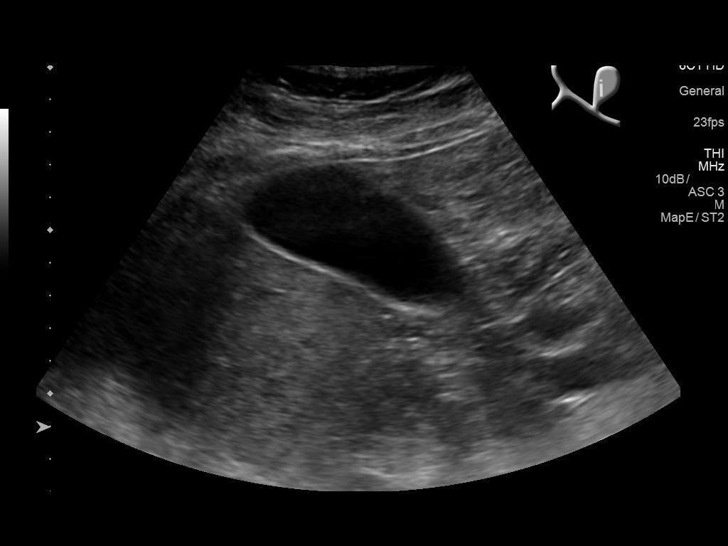
[im 8/92]
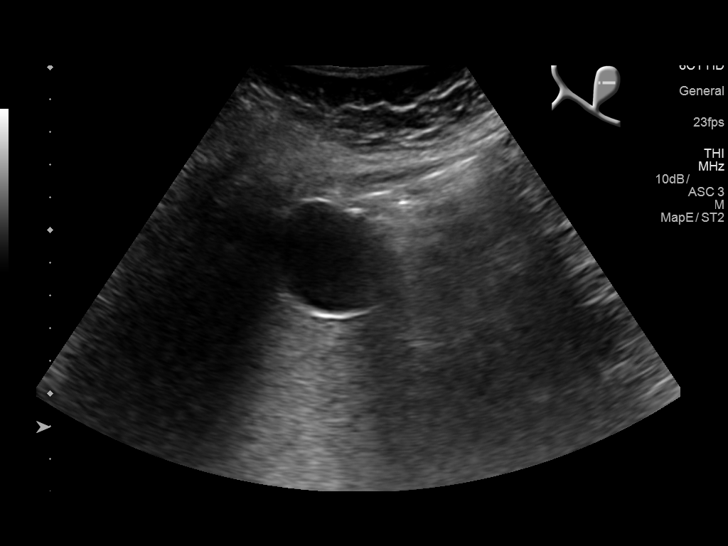
[im 16/92]
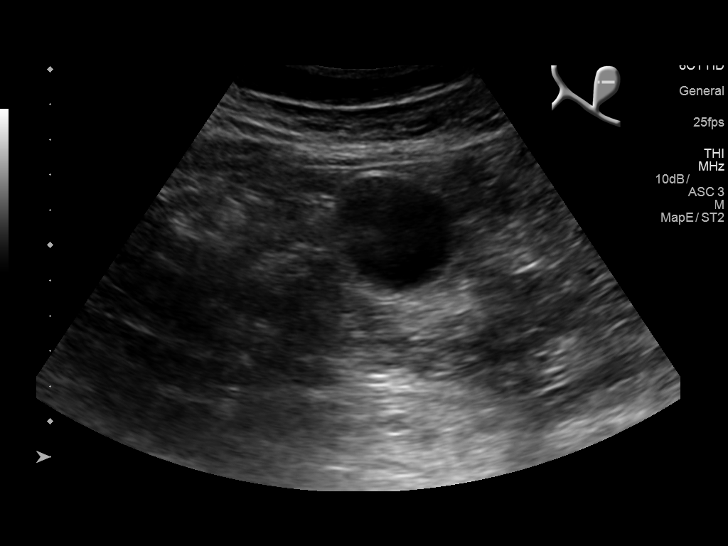
[im 23/92]
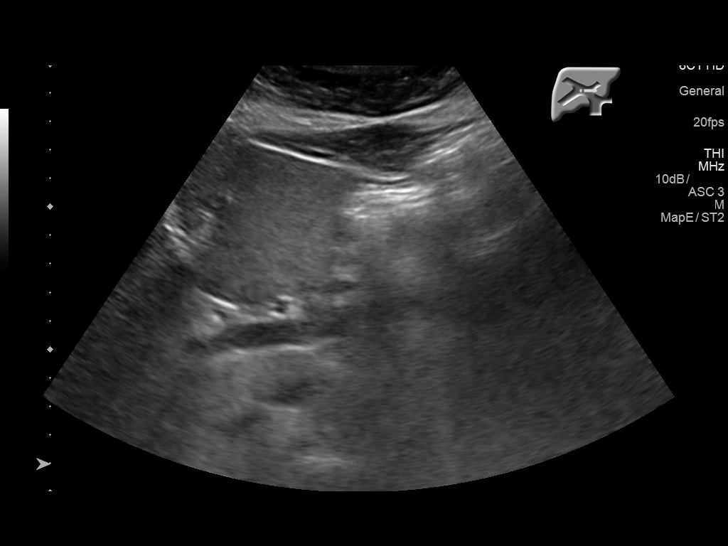
[im 31/92]
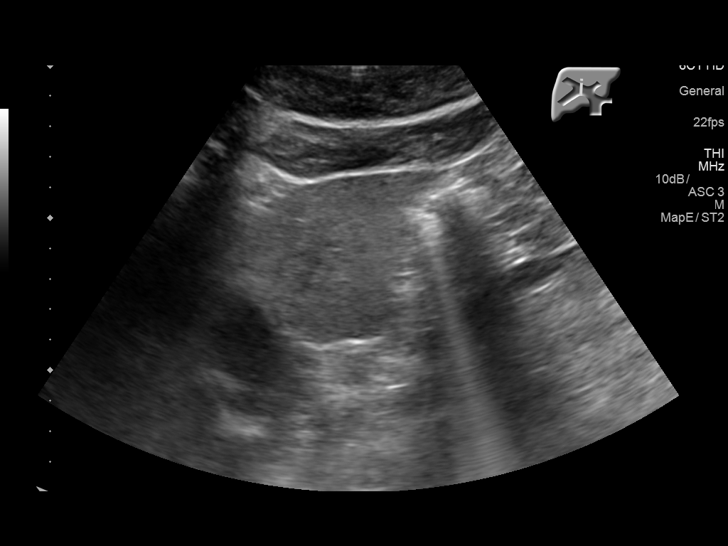
[im 38/92]
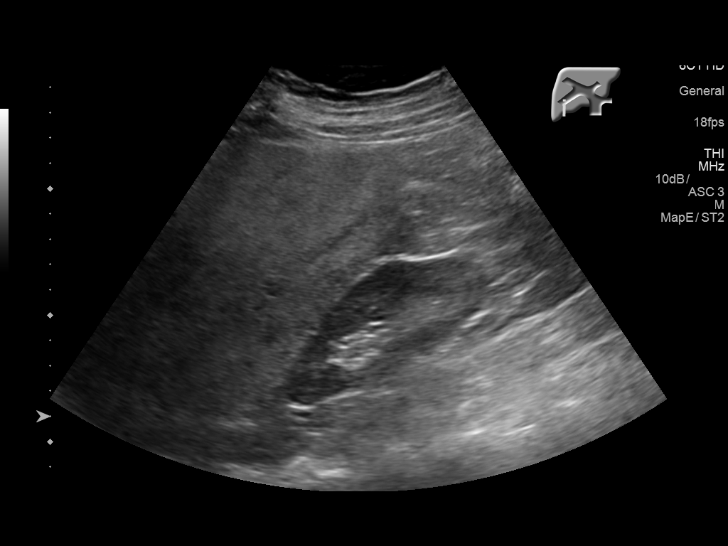
[im 46/92]
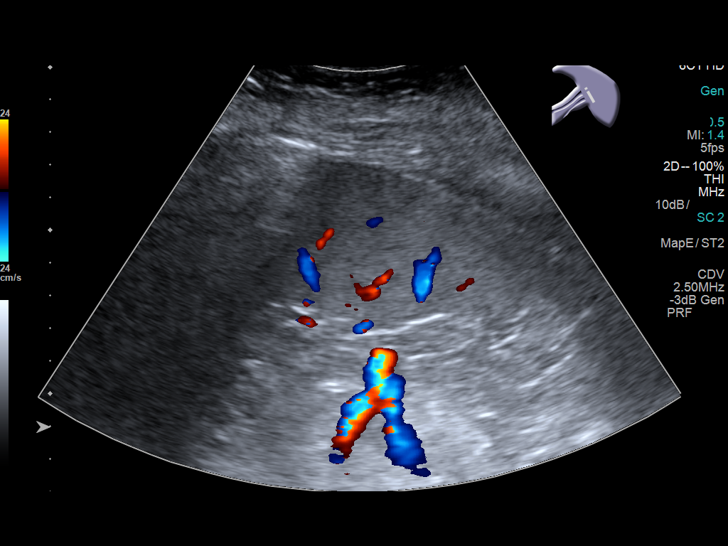
[im 54/92]
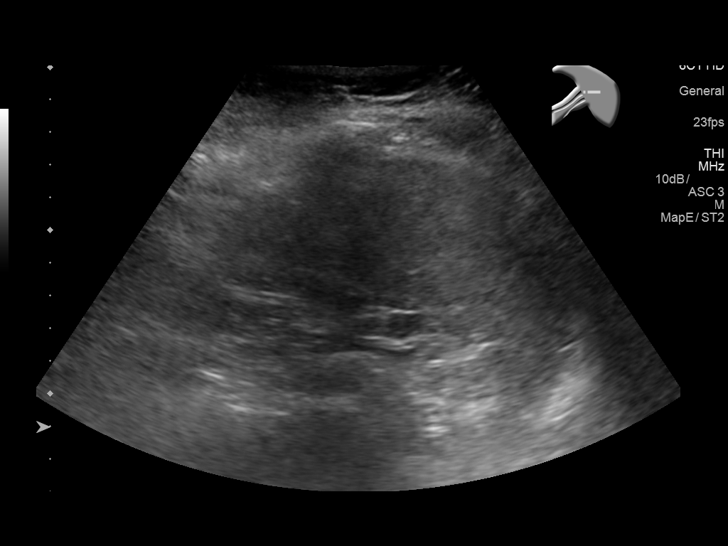
[im 61/92]
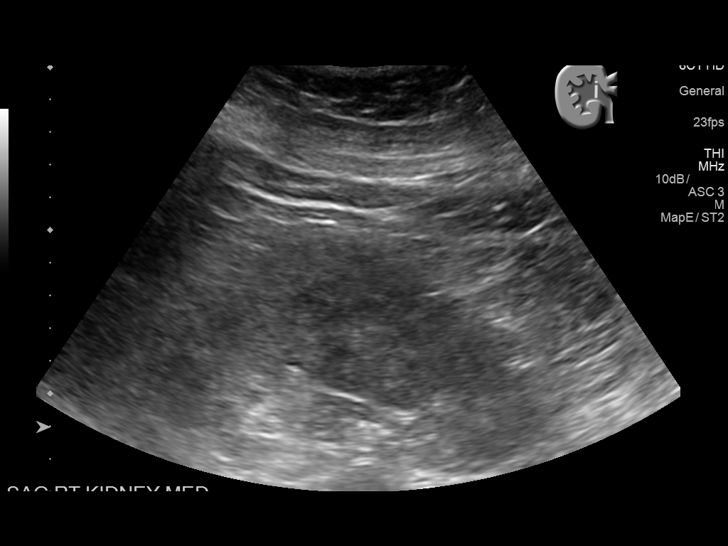
[im 69/92]
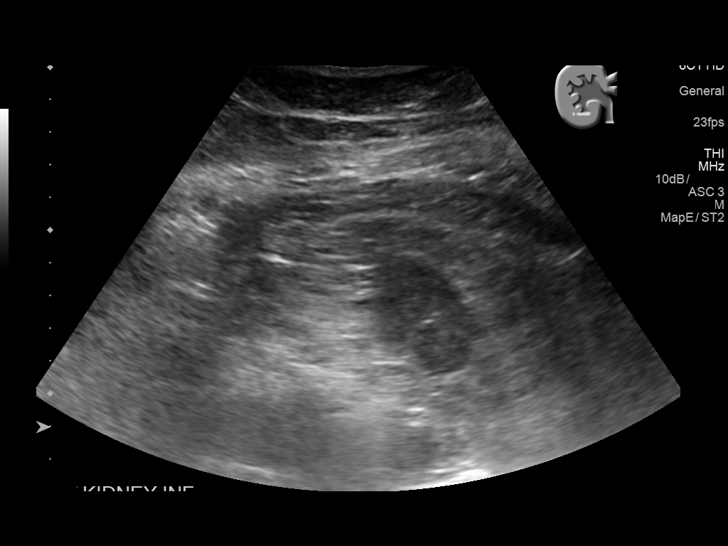
[im 76/92]
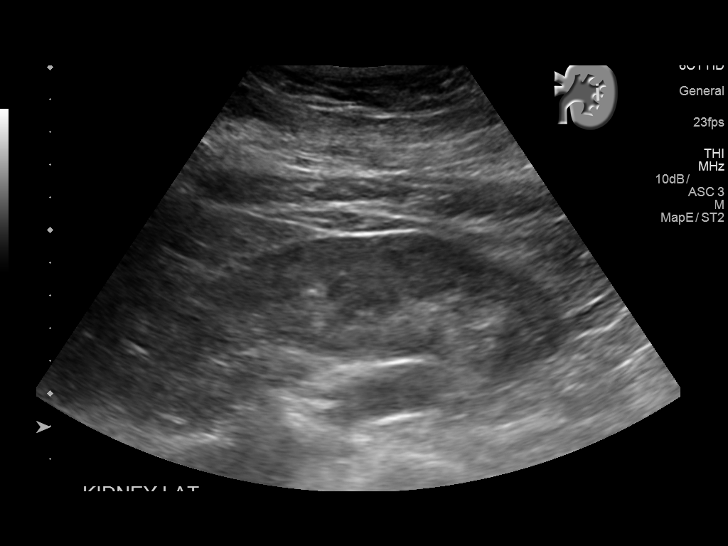
[im 84/92]
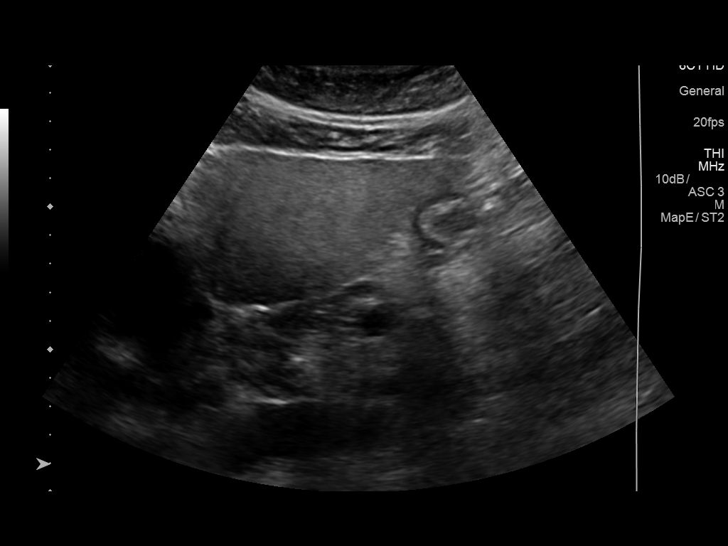
[im 92/92]
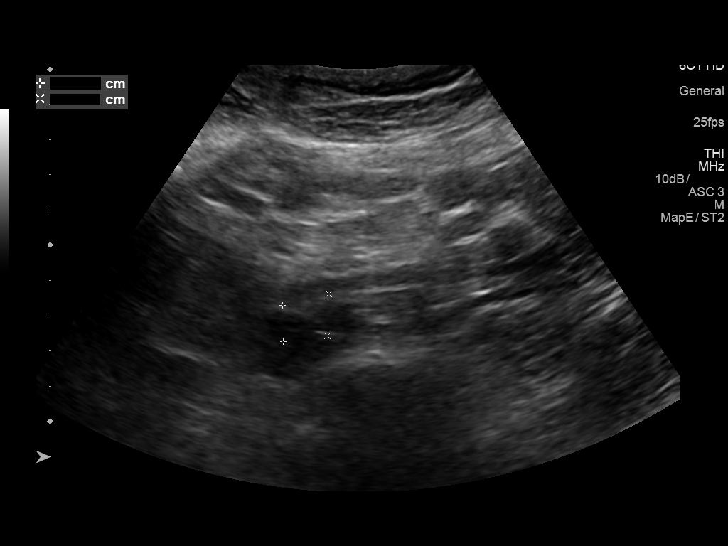

[13 of 25 positions shown; findings below may reference images not displayed]

FINDINGS: Gallbladder: No gallstones or wall thickening visualized. There is
no pericholecystic fluid. No sonographic Murphy sign noted by
sonographer.

Common bile duct: Diameter: 4 mm. No intrahepatic, common hepatic,
or common bile duct dilatation.

Liver: No focal lesion identified. Liver echogenicity overall is
increased. Portal vein is patent on color Doppler imaging with
normal direction of blood flow towards the liver.

IVC: No abnormality visualized.

Pancreas: Visualized portion unremarkable. Portions of the pancreas
are obscured by gas.

Spleen: Size and appearance within normal limits.

Right Kidney: Length: 11.6 cm. Echogenicity within normal limits. No
mass or hydronephrosis visualized.

Left Kidney: Length: 11.6 cm. Echogenicity within normal limits. No
mass or hydronephrosis visualized.

Abdominal aorta: No aneurysm visualized.

Other findings: No demonstrable ascites.
IMPRESSION: 1. Increase in liver echogenicity, a finding most likely indicative
of a degree of hepatic steatosis. While no focal liver lesions are
evident on this study, it must be cautioned that the sensitivity of
ultrasound for detection of focal liver lesions is diminished in
this circumstance.

2. Much of pancreas obscured by gas. Visualized portions of pancreas
appear unremarkable.

3.  Study otherwise unremarkable.

## 2020-01-21 ENCOUNTER — Other Ambulatory Visit: Payer: Self-pay

## 2020-01-21 ENCOUNTER — Encounter: Payer: Self-pay | Admitting: Family

## 2020-01-21 ENCOUNTER — Ambulatory Visit (INDEPENDENT_AMBULATORY_CARE_PROVIDER_SITE_OTHER): Payer: BC Managed Care – PPO | Admitting: Family

## 2020-01-21 VITALS — BP 136/78 | HR 69 | Temp 98.1°F | Ht 63.0 in | Wt 202.0 lb

## 2020-01-21 DIAGNOSIS — R7309 Other abnormal glucose: Secondary | ICD-10-CM

## 2020-01-21 DIAGNOSIS — R0683 Snoring: Secondary | ICD-10-CM

## 2020-01-21 DIAGNOSIS — Z13220 Encounter for screening for lipoid disorders: Secondary | ICD-10-CM | POA: Diagnosis not present

## 2020-01-21 DIAGNOSIS — Z0000 Encounter for general adult medical examination without abnormal findings: Secondary | ICD-10-CM

## 2020-01-21 DIAGNOSIS — R41840 Attention and concentration deficit: Secondary | ICD-10-CM

## 2020-01-21 LAB — COMPREHENSIVE METABOLIC PANEL
ALT: 23 U/L (ref 0–35)
AST: 16 U/L (ref 0–37)
Albumin: 4.2 g/dL (ref 3.5–5.2)
Alkaline Phosphatase: 57 U/L (ref 39–117)
BUN: 12 mg/dL (ref 6–23)
CO2: 27 mEq/L (ref 19–32)
Calcium: 9.6 mg/dL (ref 8.4–10.5)
Chloride: 106 mEq/L (ref 96–112)
Creatinine, Ser: 0.7 mg/dL (ref 0.40–1.20)
GFR: 84.78 mL/min (ref >60.00)
Glucose, Bld: 101 mg/dL — ABNORMAL HIGH (ref 70–99)
Potassium: 4.3 mEq/L (ref 3.5–5.1)
Sodium: 139 mEq/L (ref 135–145)
Total Bilirubin: 0.6 mg/dL (ref 0.2–1.2)
Total Protein: 7.7 g/dL (ref 6.0–8.3)

## 2020-01-21 LAB — LIPID PANEL
Cholesterol: 214 mg/dL — ABNORMAL HIGH (ref 0–200)
HDL: 41.9 mg/dL (ref >39.00)
LDL Cholesterol: 146 mg/dL — ABNORMAL HIGH (ref 0–99)
NonHDL: 171.68
Total CHOL/HDL Ratio: 5
Triglycerides: 127 mg/dL (ref 0.0–149.0)
VLDL: 25.4 mg/dL (ref 0.0–40.0)

## 2020-01-21 LAB — HEMOGLOBIN A1C: Hgb A1c MFr Bld: 5.7 % (ref 4.6–6.5)

## 2020-01-21 MED ORDER — NEOMYCIN-POLYMYXIN-HC 3.5-10000-1 OT SOLN: 3.0000 [drp] | Freq: Three times a day (TID) | OTIC | 0 refills | Status: DC

## 2020-01-21 MED ORDER — FLUTICASONE PROPIONATE 50 MCG/ACT NA SUSP: 2.0000 | Freq: Every day | NASAL | 6 refills | Status: DC

## 2020-01-21 NOTE — Progress Notes (Signed)
[name] [name] is a 64 y.o. female with the following history as recorded in Epic[facility]:  Patient Active Problem List   Diagnosis Date Noted  . Crohn's colitis ([place]) 02/08/2015  . DIARRHEA 01/03/2011  . ABNORMAL TRANSAMINASE, (LFT'S) 06/07/2009  . 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 CROHN'S DISEASE 03/08/2009    Current Outpatient Medications  Medication Sig Dispense Refill  . calcium-vitamin D 250-100 MG-UNIT per tablet Take 1 tablet by mouth 2 (two) times daily.    . carboxymethylcellulose (REFRESH PLUS) 0.5 % SOLN Place 1 drop into both eyes 3 (three) times daily as needed (Dry eyes).    . Coenzyme Q10 (CO Q-10 PO) Take 1 tablet by mouth daily.    Marland Kitchen HUMIRA PEN 40 MG/0.8ML PNKT INJECT 40 MG INTO THE SKIN EVERY 14 (FOURTEEN) DAYS. 2 each 1  . hydrocortisone (ANUSOL-HC) 2.5 % rectal cream Place 1 application rectally at bedtime. Intrarectally 30 g 0  . Multiple Vitamin (MULTIVITAMIN) tablet Take 1 tablet by mouth daily.    . niacin 100 MG tablet Take 100 mg by mouth daily with breakfast.    . vitamin B-12 (CYANOCOBALAMIN) 100 MCG tablet Take 50 mcg by mouth daily.    . fluticasone (FLONASE) 50 MCG/ACT nasal spray Place 2 sprays into both nostrils daily. 16 g 6  . neomycin-polymyxin-hydrocortisone (CORTISPORIN) OTIC solution Place 3 drops into both ears 3 (three) times daily. 10 mL 0   No current facility-administered medications for this visit.    Allergies: Patient has no known allergies.  Past Medical History:  Diagnosis Date  . Abnormal LFTs severaal years agio, medication related  . Anemia   . Breast mass, right 08/24/2013   Excised 11/10/13. 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 Contact lens/glasses fitting    wears contacts or glasses  . Crohn disease ([place])   . Diverticulosis     Past Surgical History:  Procedure Laterality Date  . ABDOMINAL HYSTERECTOMY  2003   lt SO-rt ovarian cysy  . adnoids removed    . BREAST LUMPECTOMY WITH NEEDLE LOCALIZATION  Right 11/10/2013   Procedure: RIGHT BREAST MASS NEEDLE LOCALIZATION;  Surgeon: [name], MD;  Location: ;  Service: General;  Laterality: Right;  . BREAST SURGERY    . LAPAROSCOPIC PARTIAL COLECTOMY N/A 02/08/2015   Procedure: LAPAROSCOPIC PARTIAL COLECTOMY;  Surgeon: Excell Seltzer, MD;  Location: [facility];  Service: General;  Laterality: N/A;    Family History  Problem Relation Age of Onset  . Lung cancer Father   . Cancer Father   . Diabetes Mother   . Diabetes Brother   . Breast cancer Paternal Aunt   . Stomach cancer Maternal Aunt   . Diabetes Maternal Grandfather   . Heart disease Maternal Grandfather   . Heart disease Paternal Grandfather   . Colon cancer Neg Hx   . Esophageal cancer Neg Hx   . Rectal cancer Neg Hx     Social History   Tobacco Use  . Smoking status: Former Smoker    Packs/day: 2.00    Years: 10.00    Pack years: 20.00    Types: Cigarettes    Quit date: 08/24/1988    Years since quitting: 31.4  . Smokeless tobacco: Never Used  Substance Use Topics  . Alcohol use: Yes    Alcohol/week: 2.0 - 3.0 standard drinks    Types: 2 - 3 Standard drinks or equivalent per week    Comment: social    Subjective:  Presents for yearly CPE; in baseline state of health today; Sees GYN and GI regularly; ( Dr. Corinna Capra for [facility]) Sees eye doctor and dentist regularly; Has retired from Music therapist- exercising 5 days/ week; eating comfort food in quarantine; have purchased RV and hope to start traveling soon;   Review of Systems  Constitutional: Negative.   HENT: Negative.   Eyes: Negative.   Respiratory: Negative.   Cardiovascular: Negative.   Gastrointestinal: Negative.   Genitourinary: Negative.   Musculoskeletal: Negative.   Skin: Negative.   Neurological: Negative.   Endo/Heme/Allergies: Negative.   Psychiatric/Behavioral: Negative.      Health Maintenance  Topic Date Due  . HIV Screening  01/06/1973   . TETANUS/TDAP  01/06/1977  . MAMMOGRAM  11/24/2020  . PAP SMEAR-Modifier  11/24/2020  . COLONOSCOPY  06/03/2023  . INFLUENZA VACCINE  Completed  . Hepatitis C Screening  Completed     Objective:  Vitals:   01/21/20 0929  BP: 136/78  Pulse: 69  Temp: 98.1 F (36.7 C)  TempSrc: Oral  SpO2: 97%  Weight: 202 lb (91.6 kg)  Height: 5' 3"  (1.6 m)    General: Well developed, well nourished, in no acute distress  Skin : Warm and dry.  Head: Normocephalic and atraumatic  Eyes: Sclera and conjunctiva clear; pupils round and reactive to light; extraocular movements intact  Ears: External normal; canals clear; tympanic membranes normal  Oropharynx: Pink, supple. No suspicious lesions  Neck: Supple without thyromegaly, adenopathy  Lungs: Respirations unlabored; clear to auscultation bilaterally without wheeze, rales, rhonchi  CVS exam: normal rate and regular rhythm.  Abdomen: Soft; nontender; nondistended; normoactive bowel sounds; no masses or hepatosplenomegaly  Musculoskeletal: No deformities; no active joint inflammation  Extremities: No edema, cyanosis, clubbing  Vessels: Symmetric bilaterally  Neurologic: Alert and oriented; speech intact; face symmetrical; moves all extremities well; CNII-XII intact without focal deficit   Assessment:  1. PE (physical exam), annual   2. Lipid screening   3. Elevated glucose   4. Loud snoring   5. Attention and concentration deficit     Plan:  Age appropriate preventive healthcare needs addressed; encouraged regular eye doctor and dental exams; encouraged regular exercise and weight loss; will update labs and refills as needed today; follow-up to be determined; Ear lavage completed with no difficulty; Referral to neurology for sleep study; Patient is requesting to be screened for ADHD- does not necessarily wants medication but is interested in information;  Continue with GYN and GI as scheduled;   This visit occurred during the  SARS-CoV-2 public health emergency.  Safety protocols were in place, including screening questions prior to the visit, additional usage of staff PPE, and extensive cleaning of exam room while observing appropriate contact time as indicated for disinfecting solutions.     No follow-ups on file.  Orders Placed This Encounter  Procedures  . Comp Met (CMET)  . Lipid panel  . HgB A1c  . Ambulatory referral to Neurology    Referral Priority:   Routine    Referral Type:   Consultation    Referral Reason:   Specialty Services Required    Requested Specialty:   Neurology    Number of Visits Requested:   1  . Ambulatory referral to Psychology    Referral Priority:   Routine    Referral Type:   Psychiatric    Referral Reason:   Specialty Services Required    Requested Specialty:   Psychology    Number of Visits  Requested:   1    Requested Prescriptions   Signed Prescriptions Disp Refills  . neomycin-polymyxin-hydrocortisone (CORTISPORIN) OTIC solution 10 mL 0    Sig: Place 3 drops into both ears 3 (three) times daily.  . fluticasone (FLONASE) 50 MCG/ACT nasal spray 16 g 6    Sig: Place 2 sprays into both nostrils daily.

## 2020-01-24 ENCOUNTER — Other Ambulatory Visit: Payer: Self-pay | Admitting: Internal Medicine

## 2020-02-03 ENCOUNTER — Ambulatory Visit (INDEPENDENT_AMBULATORY_CARE_PROVIDER_SITE_OTHER): Payer: BC Managed Care – PPO | Admitting: Neurology

## 2020-02-03 ENCOUNTER — Other Ambulatory Visit: Payer: Self-pay

## 2020-02-03 ENCOUNTER — Encounter: Payer: Self-pay | Admitting: Neurology

## 2020-02-03 DIAGNOSIS — G4730 Sleep apnea, unspecified: Secondary | ICD-10-CM | POA: Diagnosis not present

## 2020-02-03 DIAGNOSIS — K5090 Crohn's disease, unspecified, without complications: Secondary | ICD-10-CM | POA: Diagnosis not present

## 2020-02-03 DIAGNOSIS — R0683 Snoring: Secondary | ICD-10-CM | POA: Diagnosis not present

## 2020-02-03 NOTE — Progress Notes (Signed)
SLEEP MEDICINE CLINIC    Provider:  Larey Seat, MD  Primary Care Physician:  Marrian Salvage, [place] [address]     Referring Provider: Marrian Salvage, [address] [place],  [place] [address]          Chief Complaint according to patient   Patient presents with:    . New Patient (Initial Visit)     pt states never had a SS. discusses that she is snoring and loudly. she will wake up to a irregular breath, wakes up frequently during the night- from snoring and gasping, has probably  adult ADD/ ADHD. Marland Kitchen some days she feels tired more than others.       HISTORY OF PRESENT ILLNESS:  [name] [name] is a 64 year-old Caucasian female patient and seen here as a referral on 02/03/2020 from her PCP  for a sleep evaluation. Chief concern according to patient :  " not been sleepy but sleep is fragmented"   I have the pleasure of seeing [name] [name] , a left-handed Caucasian female with a possible sleep disorder.  She  has a past medical history of Abnormal LFTs (severaal years agio, medication related), Anemia, Breast mass, right (08/24/2013), morbid obesity, Contact lens/glasses fitting, Crohn disease ([place]), and Diverticulosis.. ADD.     Sleep relevant medical history: Nocturia 2-3 ties, adenoid - but not Tonsillectomy.   Family medical /sleep history: mother was and brother is a heavy snorer.  Younger siblings may have sleepwalked.   Social history:  has several college degrees. 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   Family status is married without children. She has stepchildren.  Pets are present. 3 cats.  Tobacco use; quit 33 years ago , no other form.  ETOH use; 2 a week. , Caffeine intake in form of Coffee(2 cups ) Soda( rare ) Tea ( hot 1-2 cups) or energy drinks. Regular exercise in form of  Walking 3 miles, 5 days a week.  Hobbies :  artistic    Sleep habits are as follows: The patient's dinner time is between 6 PM. The patient goes to bed at 10.30-11 PM and continues to sleep for 1-2 hours, wakes for 2-3 bathroom breaks,and snores more after 3 AM.  The preferred sleep position is laterally, but ends up on her back. Sleeps  with the support of 2 pillows.  Flat bed. Dreams are reportedly rare.  7 AM is the usual rise time. The patient wakes up spontaneously.  She reports not feeling refreshed or restored in AM, with symptoms such as dry mouth , rare morning headaches, and residual fatigue.  Naps are taken rarely.   Review of Systems: Out of a complete 14 system review, the patient complains of only the following symptoms, and all other reviewed systems are negative.:  Fatigue, sleepiness , snoring, fragmented sleep from snoring and nocturia/.    How likely are you to doze in the following situations: 0 = not likely, 1 = slight chance, 2 = moderate chance, 3 = high chance   Sitting and Reading? Watching Television? Sitting inactive in a public place (theater or meeting)? As a passenger in a car for an hour without a break? Lying down in the afternoon when circumstances permit? Sitting and talking to someone? Sitting quietly after lunch without alcohol? In a car, while stopped  for a few minutes in traffic?   Total = 6/ 24 points   FSS endorsed at 23/ 63 points.   Social History   Socioeconomic History  . Marital status: Married    Spouse name: Not on file  . Number of children: 0  . Years of education: Not on file  . Highest education level: Not on file  Occupational History  . Occupation: over the road truck driver  Tobacco Use  . Smoking status: Former Smoker    Packs/day: 2.00    Years: 10.00    Pack years: 20.00    Types: Cigarettes    Quit date: 08/24/1988    Years since quitting: 31.4  . Smokeless tobacco: Never Used  Substance and Sexual Activity  . Alcohol use: Yes    Alcohol/week: 2.0 - 3.0  standard drinks    Types: 2 - 3 Standard drinks or equivalent per week    Comment: social  . Drug use: No  . Sexual activity: Not on file  Other Topics Concern  . Not on file  Social History Narrative  . Not on file   Social Determinants of Health   Financial Resource Strain:   . Difficulty of Paying Living Expenses:   Food Insecurity:   . Worried About Charity fundraiser in the Last Year:   . Arboriculturist in the Last Year:   Transportation Needs:   . Film/video editor (Medical):   Marland Kitchen Lack of Transportation (Non-Medical):   Physical Activity:   . Days of Exercise per Week:   . Minutes of Exercise per Session:   Stress:   . Feeling of Stress :   Social Connections:   . Frequency of Communication with Friends and Family:   . Frequency of Social Gatherings with Friends and Family:   . Attends Religious Services:   . Active Member of Clubs or Organizations:   . Attends Archivist Meetings:   Marland Kitchen Marital Status:     Family History  Problem Relation Age of Onset  . Lung cancer Father   . Cancer Father   . Diabetes Mother   . Diabetes Brother   . Breast cancer Paternal Aunt   . Stomach cancer Maternal Aunt   . Diabetes Maternal Grandfather   . Heart disease Maternal Grandfather   . Heart disease Paternal Grandfather   . Colon cancer Neg Hx   . Esophageal cancer Neg Hx   . Rectal cancer Neg Hx     Past Medical History:  Diagnosis Date  . Abnormal LFTs severaal years agio, medication related  . Anemia   . Breast mass, right 08/24/2013   Excised 11/10/13. 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 Contact lens/glasses fitting    wears contacts or glasses  . Crohn disease ([place])   . Diverticulosis     Past Surgical History:  Procedure Laterality Date  . ABDOMINAL HYSTERECTOMY  2003   lt SO-rt ovarian cysy  . adnoids removed    . BREAST LUMPECTOMY WITH NEEDLE LOCALIZATION Right 11/10/2013   Procedure: RIGHT BREAST MASS NEEDLE LOCALIZATION;   Surgeon: [name], MD;  Location: [facility];  Service: General;  Laterality: Right;  . BREAST SURGERY    . LAPAROSCOPIC PARTIAL COLECTOMY N/A 02/08/2015   Procedure: LAPAROSCOPIC PARTIAL COLECTOMY;  Surgeon: Excell Seltzer, MD;  Location: [facility];  Service: General;  Laterality: N/A;     Current Outpatient Medications on File Prior to Visit  Medication Sig Dispense Refill  . calcium-vitamin D 250-100 MG-UNIT per tablet Take 1 tablet by mouth 2 (two) times daily.    . carboxymethylcellulose (REFRESH PLUS) 0.5 % SOLN Place 1 drop into both eyes 3 (three) times daily as needed (Dry eyes).    . Coenzyme Q10 (CO Q-10 PO) Take 1 tablet by mouth daily.    . fluticasone (FLONASE) 50 MCG/ACT nasal spray Place 2 sprays into both nostrils daily. 16 g 6  . HUMIRA PEN 40 MG/0.8ML PNKT INJECT 1 PEN UNDER THE SKIN (SUBCUTANEOUS INJECTION) EVERY TWO WEEKS 2 each 2  . hydrocortisone (ANUSOL-HC) 2.5 % rectal cream Place 1 application rectally at bedtime. Intrarectally 30 g 0  . Multiple Vitamin (MULTIVITAMIN) tablet Take 1 tablet by mouth daily.    Marland Kitchen neomycin-polymyxin-hydrocortisone (CORTISPORIN) OTIC solution Place 3 drops into both ears 3 (three) times daily. 10 mL 0  . niacin 100 MG tablet Take 100 mg by mouth daily with breakfast.    . vitamin B-12 (CYANOCOBALAMIN) 100 MCG tablet Take 50 mcg by mouth daily.     No current facility-administered medications on file prior to visit.    No Known Allergies  Physical exam:  Today's Vitals   02/03/20 1447  BP: 120/77  Pulse: 73  Temp: 97.7 F (36.5 C)  Weight: 200 lb (90.7 kg)  Height: 5' 3"  (1.6 m)   Body mass index is 35.43 kg/m.   Wt Readings from Last 3 Encounters:  02/03/20 200 lb (90.7 kg)  01/21/20 202 lb (91.6 kg)  08/18/19 196 lb 12.8 oz (89.3 kg)     Ht Readings from Last 3 Encounters:  02/03/20 5' 3"  (1.6 m)  01/21/20 5' 3"  (1.6 m)  08/18/19 5' 3"  (1.6 m)      General: The patient is awake, alert and  appears not in acute distress. The patient is well groomed. Head: Normocephalic, atraumatic. Neck is supple. Mallampati 3,  neck circumference:16.5  inches .  Nasal airflow  patent.  Retrognathia is seen.  Dental status: intact  Cardiovascular:  Regular rate and cardiac rhythm by pulse,  without distended neck veins. Respiratory: Lungs are clear to auscultation.  Skin:   evidence of ankle edema, no rash. Trunk: The patient's posture is erect.   Neurologic exam : The patient is awake and alert, oriented to place and time.   Memory subjective described as intact.  Attention span & concentration ability appears normal.  Speech is fluent,  without  dysarthria, dysphonia or aphasia.  Mood and affect are appropriate.   Cranial nerves: no loss of smell or taste reported  Pupils are equal and briskly reactive to light. Funduscopic exam deferred. .  Extraocular movements in vertical and horizontal planes were intact and without nystagmus. No Diplopia. Visual fields by finger perimetry are intact. Hearing was intact to soft voice and finger rubbing.    Facial sensation intact to fine touch.  Facial motor strength is symmetric and tongue and uvula move midline.  Neck ROM : rotation, tilt and flexion extension were normal for age and shoulder shrug was symmetrical.    Motor exam:  Symmetric bulk, tone and ROM.   Normal tone without cog- wheeling, high grip strength . Strong hip adduction.    Sensory:  Fine touch, pinprick and vibration were tested  and  normal.  Proprioception tested in the upper extremities was normal.   Coordination: Rapid alternating movements in the fingers/hands were of normal speed.  The Finger-to-nose maneuver was intact without evidence of  ataxia, dysmetria or tremor.   Gait and station: Patient could rise unassisted from a seated position, walked without assistive device.  Stance is of normal width/ base and the patient turned with 3 steps.  Toe and heel walk were  deferred.  Deep tendon reflexes: in the  upper and lower extremities are symmetric and intact at 1 plus. No clonus and not brisk. .  Babinski response was deferred .      Mrs. Hayden Pedro clinical description is that of snoring herself out of sleep, waking up mostly when she finds herself in supine sleep position.  Her husband has noted her to snore but this seems to be also an irregular breathing rhythm.  This indicates the presence of apnea and we will check this with a home sleep test.  She does not have a history of pulmonary disease she quit smoking 33 years ago, her blood pressure here is in normal range her only risk factor is an elevated BMI of 35.4.  She endorsed mild high degree of daytime sleepiness no fatigue and her geriatric depression score was endorsed at a lower clinical level.  Among her medications are no sleepy making drugs.  She is treated for crohn's disease on Humira.  Autoimmune diseases can be associated with increased levels of fatigue but he is is is not the case.  She did state that she had a hard time in college with concentration focusing on multitasking and was more of a daydreamer.  An evaluation if she could have either of her adult ADD is on the way.   Again ,my goal is to obtain a home sleep test.    After spending a total time of 45 minutes face to face and  for physical and neurologic examination, review of laboratory studies,  personal review of imaging studies, reports and results of other testing and review of referral information / records as far as provided in visit, I have established the following Plan to proceed with:  1) HST or attended PSG, both are valid options for screening of apnea. SPLIT study declined.    I would like to thank Marrian Salvage, FNP  for allowing me to meet with and to take care of this pleasant patient.   In short, [name] [name] is presenting with snoring but not fatigue, she is obese and has had witnessed apneas.   I plan to  follow up either personally or through our NP within 2-3 month.     Electronically signed by: Larey Seat, MD 02/03/2020 3:16 PM  [facility] Board certified by The [employer] of Sleep Medicine and Diplomate of the [employer] of Sleep Medicine. Board certified In Neurology through the [place], Fellow of the [employer] of Neurology. Medical Director of [facility].

## 2020-02-03 NOTE — Patient Instructions (Signed)

## 2020-02-08 DIAGNOSIS — M170 Bilateral primary osteoarthritis of knee: Secondary | ICD-10-CM | POA: Diagnosis not present

## 2020-02-21 ENCOUNTER — Other Ambulatory Visit: Payer: Self-pay

## 2020-02-21 ENCOUNTER — Ambulatory Visit (INDEPENDENT_AMBULATORY_CARE_PROVIDER_SITE_OTHER): Payer: BC Managed Care – PPO | Admitting: Neurology

## 2020-02-21 DIAGNOSIS — G4730 Sleep apnea, unspecified: Secondary | ICD-10-CM

## 2020-02-21 DIAGNOSIS — R0683 Snoring: Secondary | ICD-10-CM

## 2020-02-21 DIAGNOSIS — K5090 Crohn's disease, unspecified, without complications: Secondary | ICD-10-CM

## 2020-02-21 DIAGNOSIS — G4733 Obstructive sleep apnea (adult) (pediatric): Secondary | ICD-10-CM

## 2020-02-21 DIAGNOSIS — G478 Other sleep disorders: Secondary | ICD-10-CM

## 2020-02-29 DIAGNOSIS — G478 Other sleep disorders: Secondary | ICD-10-CM | POA: Insufficient documentation

## 2020-02-29 NOTE — Procedures (Signed)
Patient Information     First Name: Sahithi Last Name: Buckman ID: [id number]  Birth Date: Feb 05, 1956 Age: 64 Gender: Female  Referring Provider: Jodi Mourning, FNP BMI: 35.5 (W=200 lbs, H=5' 3'')  Neck Circ.:  16 '' Epworth:  6   History:    [name] [name] is a 64 year-old Caucasian female patient and seen here as a referral on 02/03/2020 from her PCP for a sleep evaluation. Chief concern according to patient:  " I have not been sleepy, but sleep is fragmented"   I have the pleasure of seeing [name] [name], a left-handed Caucasian female with a medical history of Abnormal LFTs (several years ago, medication related), Anemia, Breast mass, right (08/24/2013), morbid obesity, Contact lens/glasses fitting, Crohn disease ([place]), and Diverticulosis, and ADD.                 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  This mild apnea may not be the sole cause of sleep fragmentation. The HST suggests that the patient has a high amount of REM sleep and sufficient overall sleep time.    Recommendations:     I like to repeat this sleep evaluation as an attended sleep study. My concern is that sleep perception may play a bigger role than organic /objective sleep fragmentation.  In regards to the mild apnea, I would offer the patient a trial of CPAP.  Auto CPAP from 5-12 cm water, 1 cm EPR, heated humidity and mask of patient's comfort and choice.    Interpreting Physician: Jeb Levering, MD             Sleep Summary  Oxygen Saturation Statistics   Start Study Time: End Study Time: Total Recording Time:  11:50:04PM 7:54:41 AM 8 hrs, 4 min  Total Sleep Time % REM of Sleep Time:  6 hrs, 52 min 28.0    Mean: 94 Minimum: 88 Maximum: 98  Mean of Desaturations Nadirs (%):   91  Oxygen Desatur. %: 4-9 10-20 >20 Total  Events Number Total  6 100.0  0 0.0  0 0.0  6 100.0  Oxygen Saturation: <90 <=88 <85 <80 <70    Duration (minutes): Sleep % 0.2 0.1 0.0 0.0 0.0 0.0 0.0 0.0 0.0 0.0     Respiratory Indices      Total Events REM NREM All Night  pRDI:  81  pAHI:  34 ODI:  6  pAHIc:  0 % CSR: 0.0 17.3 11.5 3.1 0.0 9.7 2.4 0.0 0.0 11.8 5.0 0.9 0.0       Pulse Rate Statistics during Sleep (BPM)      Mean: 53 Minimum: 41 Maximum: 87    Indices are calculated using technically valid sleep time of  6 hrs, 51 min. Central-Indices are calculated using technically valid sleep time of  4  hrs, 21 min. pRDI/    Body Position Statistics  Position Supine Prone Right Left Non-Supine  Sleep (min) 221.0 0.0 191.0 0.0 191.0  Sleep % 53.6 0.0 46.4 0.0 46.4  pRDI 14.4 N/A 8.8 N/A 8.8  pAHI 5.2 N/A 4.7 N/A 4.7  ODI 1.6 N/A 0.0 N/A 0.0     Snoring Statistics Snoring Level (dB) >40 >50 >60 >70 >80 >Threshold (45)  Sleep (min) 32.2 2.0 0.5 0.1 0.0 3.7  Sleep % 7.8 0.5 0.1 0.0 0.0 0.9    Mean:

## 2020-02-29 NOTE — Addendum Note (Signed)
Addended by: Larey Seat on: 02/29/2020 04:44 PM   Modules accepted: Orders

## 2020-02-29 NOTE — Progress Notes (Signed)
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  This mild apnea may not be the sole cause of sleep fragmentation.  The HST suggests that the patient has a high amount of REM sleep  and sufficient overall sleep time.    Recommendations:    I like to repeat this sleep evaluation as an attended sleep  study. My concern is that sleep perception may play a bigger role  than organic /objective sleep fragmentation.   In regards to the mild apnea, I would offer the patient a trial  of CPAP. This is optional-  Auto CPAP from 5-12 cm water, 1 cm EPR, heated humidity  and mask of patient's comfort and choice.    Interpreting Physician: Jeb Levering, MD

## 2020-03-02 ENCOUNTER — Telehealth: Payer: Self-pay | Admitting: Neurology

## 2020-03-02 NOTE — Telephone Encounter (Signed)
Called patient to discuss sleep study results. No answer at this time. LVM for the patient to call back.   

## 2020-03-02 NOTE — Telephone Encounter (Signed)
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  This mild apnea may not be the sole cause of sleep fragmentation.  The HST suggests that the patient has a high amount of REM sleep  and sufficient overall sleep time.    Recommendations:    I like to repeat this sleep evaluation as an attended sleep  study. My concern is that sleep perception may play a bigger role  than organic /objective sleep fragmentation.   In regards to the mild apnea, I would offer the patient a trial  of CPAP. This is optional-  Auto CPAP from 5-12 cm water, 1 cm EPR, heated humidity  and mask of patient's comfort and choice.    Interpreting Physician: Jeb Levering, MD

## 2020-03-07 ENCOUNTER — Encounter: Payer: Self-pay | Admitting: Neurology

## 2020-03-07 NOTE — Telephone Encounter (Signed)
Pt returned call and I was able to review with her in detail the sleep study. Advised of the findings. Informed her that she is hoping that we can bring her into the sleep lab to get a clearer picture. Advised we were waiting on the insurance to determine whether they would approve it. She verbalized that she will wait and see what happens with that and if it doesn't get approved, advised I would reach out and inform her at that time and we will schedule a follow up at that time.

## 2020-04-06 DIAGNOSIS — S8011XA Contusion of right lower leg, initial encounter: Secondary | ICD-10-CM | POA: Diagnosis not present

## 2020-04-06 DIAGNOSIS — R2241 Localized swelling, mass and lump, right lower limb: Secondary | ICD-10-CM | POA: Diagnosis not present

## 2020-04-06 DIAGNOSIS — Y92410 Unspecified street and highway as the place of occurrence of the external cause: Secondary | ICD-10-CM | POA: Diagnosis not present

## 2020-04-23 ENCOUNTER — Other Ambulatory Visit: Payer: Self-pay | Admitting: Internal Medicine

## 2020-05-01 ENCOUNTER — Ambulatory Visit: Payer: BC Managed Care – PPO | Admitting: Psychology

## 2020-05-04 ENCOUNTER — Ambulatory Visit: Payer: BC Managed Care – PPO | Admitting: Psychology

## 2020-05-04 DIAGNOSIS — Z6836 Body mass index (BMI) 36.0-36.9, adult: Secondary | ICD-10-CM | POA: Diagnosis not present

## 2020-05-04 DIAGNOSIS — Z01419 Encounter for gynecological examination (general) (routine) without abnormal findings: Secondary | ICD-10-CM | POA: Diagnosis not present

## 2020-05-04 DIAGNOSIS — Z1231 Encounter for screening mammogram for malignant neoplasm of breast: Secondary | ICD-10-CM | POA: Diagnosis not present

## 2020-05-10 ENCOUNTER — Other Ambulatory Visit: Payer: Self-pay | Admitting: Obstetrics and Gynecology

## 2020-05-10 DIAGNOSIS — R928 Other abnormal and inconclusive findings on diagnostic imaging of breast: Secondary | ICD-10-CM

## 2020-05-18 ENCOUNTER — Other Ambulatory Visit: Payer: Self-pay | Admitting: Internal Medicine

## 2020-05-22 ENCOUNTER — Ambulatory Visit
Admission: RE | Admit: 2020-05-22 | Discharge: 2020-05-22 | Disposition: A | Discharge status: Home / Self Care | Payer: BC Managed Care – PPO | Source: Ambulatory Visit | Attending: Obstetrics and Gynecology | Admitting: Obstetrics and Gynecology

## 2020-05-22 ENCOUNTER — Other Ambulatory Visit: Payer: Self-pay

## 2020-05-22 ENCOUNTER — Other Ambulatory Visit: Payer: Self-pay | Admitting: Obstetrics and Gynecology

## 2020-05-22 DIAGNOSIS — R928 Other abnormal and inconclusive findings on diagnostic imaging of breast: Secondary | ICD-10-CM

## 2020-05-22 DIAGNOSIS — R922 Inconclusive mammogram: Secondary | ICD-10-CM | POA: Diagnosis not present

## 2020-05-22 DIAGNOSIS — N6321 Unspecified lump in the left breast, upper outer quadrant: Secondary | ICD-10-CM | POA: Diagnosis not present

## 2020-06-13 ENCOUNTER — Other Ambulatory Visit: Payer: Self-pay | Admitting: Internal Medicine

## 2020-07-04 ENCOUNTER — Ambulatory Visit: Payer: BC Managed Care – PPO | Admitting: Psychology

## 2020-07-07 ENCOUNTER — Ambulatory Visit: Payer: BC Managed Care – PPO | Admitting: Psychology

## 2020-07-07 DIAGNOSIS — F900 Attention-deficit hyperactivity disorder, predominantly inattentive type: Secondary | ICD-10-CM

## 2020-07-07 DIAGNOSIS — F422 Mixed obsessional thoughts and acts: Secondary | ICD-10-CM

## 2020-08-02 ENCOUNTER — Other Ambulatory Visit: Payer: Self-pay | Admitting: Internal Medicine

## 2020-08-04 ENCOUNTER — Ambulatory Visit (INDEPENDENT_AMBULATORY_CARE_PROVIDER_SITE_OTHER): Payer: BC Managed Care – PPO | Admitting: Family

## 2020-08-04 ENCOUNTER — Other Ambulatory Visit: Payer: Self-pay

## 2020-08-04 ENCOUNTER — Encounter: Payer: Self-pay | Admitting: Family

## 2020-08-04 VITALS — BP 126/70 | HR 77 | Temp 98.3°F | Ht 63.0 in | Wt 196.8 lb

## 2020-08-04 DIAGNOSIS — R0789 Other chest pain: Secondary | ICD-10-CM

## 2020-08-04 DIAGNOSIS — R7309 Other abnormal glucose: Secondary | ICD-10-CM

## 2020-08-04 DIAGNOSIS — E785 Hyperlipidemia, unspecified: Secondary | ICD-10-CM | POA: Diagnosis not present

## 2020-08-04 DIAGNOSIS — Z23 Encounter for immunization: Secondary | ICD-10-CM | POA: Diagnosis not present

## 2020-08-04 NOTE — Progress Notes (Signed)
[name] [name] is a 64 y.o. female with the following history as recorded in EpicCare:  Patient Active Problem List   Diagnosis Date Noted  . Abnormal REM sleep 02/29/2020  . Morbid obesity ([place]) 02/03/2020  . Sleep apnea 02/03/2020  . Loud snoring 02/03/2020  . Crohn's disease without complication ([place]) 07/37/1062  . Crohn's colitis ([place]) 02/08/2015  . DIARRHEA 01/03/2011  . ABNORMAL TRANSAMINASE, (LFT'S) 06/07/2009  . 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 CROHN'S DISEASE 03/08/2009    Current Outpatient Medications  Medication Sig Dispense Refill  . Adalimumab (HUMIRA PEN) 40 MG/0.8ML PNKT Inject 1 pen into the skin every 14 (fourteen) days. Office visit for further refills 2 each 1  . calcium-vitamin D 250-100 MG-UNIT per tablet Take 1 tablet by mouth 2 (two) times daily.    . carboxymethylcellulose (REFRESH PLUS) 0.5 % SOLN Place 1 drop into both eyes 3 (three) times daily as needed (Dry eyes).    . Coenzyme Q10 (CO Q-10 PO) Take 1 tablet by mouth daily.    . fluticasone (FLONASE) 50 MCG/ACT nasal spray Place 2 sprays into both nostrils daily. 16 g 6  . hydrocortisone (ANUSOL-HC) 2.5 % rectal cream Place 1 application rectally at bedtime. Intrarectally 30 g 0  . Multiple Vitamin (MULTIVITAMIN) tablet Take 1 tablet by mouth daily.    Marland Kitchen neomycin-polymyxin-hydrocortisone (CORTISPORIN) OTIC solution Place 3 drops into both ears 3 (three) times daily. 10 mL 0  . niacin 100 MG tablet Take 100 mg by mouth daily with breakfast.    . vitamin B-12 (CYANOCOBALAMIN) 100 MCG tablet Take 50 mcg by mouth daily.     No current facility-administered medications for this visit.    Allergies: Patient has no known allergies.  Past Medical History:  Diagnosis Date  . Abnormal LFTs severaal years agio, medication related  . Anemia   . Breast mass, right 08/24/2013   Excised 11/10/13. 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 Contact lens/glasses fitting    wears contacts or glasses  .  Crohn disease ([place])   . Diverticulosis     Past Surgical History:  Procedure Laterality Date  . ABDOMINAL HYSTERECTOMY  2003   lt SO-rt ovarian cysy  . adnoids removed    . BREAST LUMPECTOMY WITH NEEDLE LOCALIZATION Right 11/10/2013   Procedure: RIGHT BREAST MASS NEEDLE LOCALIZATION;  Surgeon: [name], MD;  Location: [place];  Service: General;  Laterality: Right;  . BREAST SURGERY    . LAPAROSCOPIC PARTIAL COLECTOMY N/A 02/08/2015   Procedure: LAPAROSCOPIC PARTIAL COLECTOMY;  Surgeon: Excell Seltzer, MD;  Location: [facility];  Service: General;  Laterality: N/A;    Family History  Problem Relation Age of Onset  . Lung cancer Father   . Cancer Father   . Diabetes Mother   . Diabetes Brother   . Breast cancer Paternal Aunt   . Stomach cancer Maternal Aunt   . Diabetes Maternal Grandfather   . Heart disease Maternal Grandfather   . Heart disease Paternal Grandfather   . Colon cancer Neg Hx   . Esophageal cancer Neg Hx   . Rectal cancer Neg Hx     Social History   Tobacco Use  . Smoking status: Former Smoker    Packs/day: 2.00    Years: 10.00    Pack years: 20.00    Types: Cigarettes    Quit date: 08/24/1988    Years since quitting: 31.9  . Smokeless tobacco: Never Used  Substance Use Topics  .  Alcohol use: Yes    Alcohol/week: 2.0 - 3.0 standard drinks    Types: 2 - 3 Standard drinks or equivalent per week    Comment: social    Subjective:   6 month follow-up on pre-diabetes/ elevated cholesterol; has actively been working on weight loss/ healthier eating;  Husband had a heart attack in July of this year; she notes she has been having some atypical chest pain- not sure if stress but wants to be sure that she has full cardiac evaluation;   Objective:  Vitals:   08/04/20 0954  BP: 126/70  Pulse: 77  Temp: 98.3 F (36.8 C)  TempSrc: Oral  SpO2: 98%  Weight: 196 lb 12.8 oz (89.3 kg)  Height: 5' 3"  (1.6 m)    General: Well developed,  well nourished, in no acute distress  Head: Normocephalic and atraumatic  Lungs: Respirations unlabored; clear to auscultation bilaterally without wheeze, rales, rhonchi  CVS exam: normal rate and regular rhythm.  Neurologic: Alert and oriented; speech intact; face symmetrical; moves all extremities well; CNII-XII intact without focal deficit   Assessment:  1. Elevated glucose   2. Needs flu shot   3. Hyperlipidemia, unspecified hyperlipidemia type   4. Atypical chest pain     Plan:  1. Check CMP, Hgba1c; 2. Flu shot given; 3. Check lipid panel; was not a candidate for medication at last check; ( ASCVD score was 5.6%); 4. Refer to cardiology;   Congratulated patient on continued weight loss efforts/ commitment to healthy lifestyle;  This visit occurred during the SARS-CoV-2 public health emergency.  Safety protocols were in place, including screening questions prior to the visit, additional usage of staff PPE, and extensive cleaning of exam room while observing appropriate contact time as indicated for disinfecting solutions.     No follow-ups on file.  Orders Placed This Encounter  Procedures  . Flu Vaccine QUAD 36+ mos IM  . Comp Met (CMET)    Standing Status:   Future    Number of Occurrences:   1    Standing Expiration Date:   08/04/2021  . Hemoglobin A1c    Standing Status:   Future    Number of Occurrences:   1    Standing Expiration Date:   08/04/2021  . Lipid panel    Standing Status:   Future    Number of Occurrences:   1    Standing Expiration Date:   08/04/2021  . Ambulatory referral to Cardiology    Referral Priority:   Routine    Referral Type:   Consultation    Referral Reason:   Specialty Services Required    Referred to Provider:   Belva Crome, MD    Requested Specialty:   Cardiology    Number of Visits Requested:   1    Requested Prescriptions    No prescriptions requested or ordered in this encounter

## 2020-08-05 LAB — COMPREHENSIVE METABOLIC PANEL
AG Ratio: 1.6 (calc) (ref 1.0–2.5)
ALT: 27 U/L (ref 6–29)
AST: 22 U/L (ref 10–35)
Albumin: 4.3 g/dL (ref 3.6–5.1)
Alkaline phosphatase (APISO): 58 U/L (ref 37–153)
BUN: 12 mg/dL (ref 7–25)
CO2: 25 mmol/L (ref 20–32)
Calcium: 9.3 mg/dL (ref 8.6–10.4)
Chloride: 106 mmol/L (ref 98–110)
Creat: 0.81 mg/dL (ref 0.50–0.99)
Globulin: 2.7 g/dL (calc) (ref 1.9–3.7)
Glucose, Bld: 100 mg/dL — ABNORMAL HIGH (ref 65–99)
Potassium: 4.6 mmol/L (ref 3.5–5.3)
Sodium: 140 mmol/L (ref 135–146)
Total Bilirubin: 0.5 mg/dL (ref 0.2–1.2)
Total Protein: 7 g/dL (ref 6.1–8.1)

## 2020-08-05 LAB — HEMOGLOBIN A1C
Hgb A1c MFr Bld: 5.6 % of total Hgb (ref <5.7)
Mean Plasma Glucose: 114 (calc)
eAG (mmol/L): 6.3 (calc)

## 2020-08-05 LAB — LIPID PANEL
Cholesterol: 209 mg/dL — ABNORMAL HIGH (ref <200)
HDL: 47 mg/dL — ABNORMAL LOW (ref >=50)
LDL Cholesterol (Calc): 135 mg/dL (calc) — ABNORMAL HIGH
Non-HDL Cholesterol (Calc): 162 mg/dL (calc) — ABNORMAL HIGH (ref <130)
Total CHOL/HDL Ratio: 4.4 (calc) (ref <5.0)
Triglycerides: 143 mg/dL (ref <150)

## 2020-08-11 DIAGNOSIS — L820 Inflamed seborrheic keratosis: Secondary | ICD-10-CM | POA: Diagnosis not present

## 2020-08-11 DIAGNOSIS — L821 Other seborrheic keratosis: Secondary | ICD-10-CM | POA: Diagnosis not present

## 2020-08-11 DIAGNOSIS — D1801 Hemangioma of skin and subcutaneous tissue: Secondary | ICD-10-CM | POA: Diagnosis not present

## 2020-09-28 ENCOUNTER — Encounter: Payer: Self-pay | Admitting: General Practice

## 2020-09-29 ENCOUNTER — Ambulatory Visit: Payer: BC Managed Care – PPO | Admitting: Interventional Cardiology

## 2020-10-10 ENCOUNTER — Other Ambulatory Visit: Payer: Self-pay | Admitting: Internal Medicine

## 2020-11-16 DIAGNOSIS — M1712 Unilateral primary osteoarthritis, left knee: Secondary | ICD-10-CM | POA: Diagnosis not present

## 2020-11-27 ENCOUNTER — Other Ambulatory Visit: Payer: BC Managed Care – PPO

## 2020-11-30 ENCOUNTER — Other Ambulatory Visit: Payer: Self-pay | Admitting: Obstetrics and Gynecology

## 2020-11-30 ENCOUNTER — Ambulatory Visit
Admission: RE | Admit: 2020-11-30 | Discharge: 2020-11-30 | Disposition: A | Discharge status: Home / Self Care | Payer: BC Managed Care – PPO | Source: Ambulatory Visit | Attending: Obstetrics and Gynecology | Admitting: Obstetrics and Gynecology

## 2020-11-30 ENCOUNTER — Other Ambulatory Visit: Payer: Self-pay

## 2020-11-30 DIAGNOSIS — R922 Inconclusive mammogram: Secondary | ICD-10-CM | POA: Diagnosis not present

## 2020-11-30 DIAGNOSIS — N6321 Unspecified lump in the left breast, upper outer quadrant: Secondary | ICD-10-CM | POA: Diagnosis not present

## 2020-11-30 DIAGNOSIS — R928 Other abnormal and inconclusive findings on diagnostic imaging of breast: Secondary | ICD-10-CM

## 2020-12-10 ENCOUNTER — Other Ambulatory Visit: Payer: Self-pay | Admitting: Internal Medicine

## 2021-03-09 DIAGNOSIS — H6123 Impacted cerumen, bilateral: Secondary | ICD-10-CM | POA: Diagnosis not present

## 2021-03-09 DIAGNOSIS — H9313 Tinnitus, bilateral: Secondary | ICD-10-CM | POA: Diagnosis not present

## 2021-03-30 ENCOUNTER — Ambulatory Visit (INDEPENDENT_AMBULATORY_CARE_PROVIDER_SITE_OTHER): Payer: BC Managed Care – PPO | Admitting: Family

## 2021-03-30 ENCOUNTER — Encounter: Payer: Self-pay | Admitting: Family

## 2021-03-30 ENCOUNTER — Other Ambulatory Visit: Payer: Self-pay

## 2021-03-30 VITALS — BP 114/76 | HR 80 | Temp 98.5°F | Ht 62.0 in | Wt 197.0 lb

## 2021-03-30 DIAGNOSIS — M25569 Pain in unspecified knee: Secondary | ICD-10-CM | POA: Diagnosis not present

## 2021-03-30 DIAGNOSIS — H9313 Tinnitus, bilateral: Secondary | ICD-10-CM | POA: Diagnosis not present

## 2021-03-30 DIAGNOSIS — R899 Unspecified abnormal finding in specimens from other organs, systems and tissues: Secondary | ICD-10-CM

## 2021-03-30 DIAGNOSIS — G8929 Other chronic pain: Secondary | ICD-10-CM

## 2021-03-30 LAB — URIC ACID: Uric Acid, Serum: 6.5 mg/dL (ref 2.4–7.0)

## 2021-03-30 LAB — COMPREHENSIVE METABOLIC PANEL
ALT: 23 U/L (ref 0–35)
AST: 16 U/L (ref 0–37)
Albumin: 4.4 g/dL (ref 3.5–5.2)
Alkaline Phosphatase: 58 U/L (ref 39–117)
BUN: 18 mg/dL (ref 6–23)
CO2: 28 mEq/L (ref 19–32)
Calcium: 9.7 mg/dL (ref 8.4–10.5)
Chloride: 103 mEq/L (ref 96–112)
Creatinine, Ser: 0.73 mg/dL (ref 0.40–1.20)
GFR: 87.64 mL/min (ref >60.00)
Glucose, Bld: 95 mg/dL (ref 70–99)
Potassium: 4.5 mEq/L (ref 3.5–5.1)
Sodium: 139 mEq/L (ref 135–145)
Total Bilirubin: 0.4 mg/dL (ref 0.2–1.2)
Total Protein: 7.3 g/dL (ref 6.0–8.3)

## 2021-03-30 NOTE — Patient Instructions (Signed)

## 2021-03-30 NOTE — Progress Notes (Signed)
[name] [name] is a 65 y.o. female with the following history as recorded in EpicCare:  Patient Active Problem List   Diagnosis Date Noted  . Abnormal REM sleep 02/29/2020  . Morbid obesity ([place]) 02/03/2020  . Sleep apnea 02/03/2020  . Loud snoring 02/03/2020  . Crohn's disease without complication ([place]) 16/08/9603  . Crohn's colitis ([place]) 02/08/2015  . DIARRHEA 01/03/2011  . ABNORMAL TRANSAMINASE, (LFT'S) 06/07/2009  . 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 CROHN'S DISEASE 03/08/2009    Current Outpatient Medications  Medication Sig Dispense Refill  . calcium-vitamin D 250-100 MG-UNIT per tablet Take 1 tablet by mouth 2 (two) times daily.    . carboxymethylcellulose (REFRESH PLUS) 0.5 % SOLN Place 1 drop into both eyes 3 (three) times daily as needed (Dry eyes).    Marland Kitchen HUMIRA PEN 40 MG/0.8ML PNKT INJECT 1 PEN UNDER THE SKIN (SUBCUTANEOUS INJECTION) EVERY 14 DAYS 2 each 1  . Multiple Vitamin (MULTIVITAMIN) tablet Take 1 tablet by mouth daily.    . vitamin B-12 (CYANOCOBALAMIN) 100 MCG tablet Take 50 mcg by mouth daily.     No current facility-administered medications for this visit.    Allergies: Patient has no known allergies.  Past Medical History:  Diagnosis Date  . Abnormal LFTs severaal years agio, medication related  . Anemia   . Breast mass, right 08/24/2013   Excised 11/10/13. 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 Contact lens/glasses fitting    wears contacts or glasses  . Crohn disease ([place])   . Diverticulosis     Past Surgical History:  Procedure Laterality Date  . ABDOMINAL HYSTERECTOMY  2003   lt SO-rt ovarian cysy  . adnoids removed    . BREAST EXCISIONAL BIOPSY Right    a few years ago   . BREAST LUMPECTOMY WITH NEEDLE LOCALIZATION Right 11/10/2013   Procedure: RIGHT BREAST MASS NEEDLE LOCALIZATION;  Surgeon: [name], MD;  Location: [facility];  Service: General;  Laterality: Right;  . BREAST SURGERY    . LAPAROSCOPIC  PARTIAL COLECTOMY N/A 02/08/2015   Procedure: LAPAROSCOPIC PARTIAL COLECTOMY;  Surgeon: Excell Seltzer, MD;  [facility]: [facility];  Service: General;  Laterality: N/A;    Family History  Problem Relation Age of Onset  . Lung cancer Father   . Cancer Father   . Diabetes Mother   . Breast cancer Mother   . Diabetes Brother   . Breast cancer Paternal Aunt   . Stomach cancer Maternal Aunt   . Diabetes Maternal Grandfather   . Heart disease Maternal Grandfather   . Heart disease Paternal Grandfather   . Colon cancer Neg Hx   . Esophageal cancer Neg Hx   . Rectal cancer Neg Hx     Social History   Tobacco Use  . Smoking status: Former Smoker    Packs/day: 2.00    Years: 10.00    Pack years: 20.00    Types: Cigarettes    Quit date: 08/24/1988    Years since quitting: 32.6  . Smokeless tobacco: Never Used  Substance Use Topics  . Alcohol use: Yes    Alcohol/week: 2.0 - 3.0 standard drinks    Types: 2 - 3 Standard drinks or equivalent per week    Comment: social    Subjective:  Participating in clinical trial for Alzheimer's Disease- had extensive labs in preparation; uric acid level was noted to be elevated; states that uric acid was at 7.6; no problems with gout/ kidney functions have  always been normal;   Also has been having increased problems with tinnitus for the past 3 months; recently had ear wax cleaned out but has not helped with symptoms; no dizziness;    Planning for partial knee replacement in July- Dr. Ronnie Derby to operate;    Objective:  Vitals:   03/30/21 1111  BP: 114/76  Pulse: 80  Temp: 98.5 F (36.9 C)  TempSrc: Oral  SpO2: 96%  Weight: 197 lb (89.4 kg)  Height: _0  (1.575 m)    General: Well developed, well nourished, in no acute distress  Skin : Warm and dry.  Head: Normocephalic and atraumatic  Eyes: Sclera and conjunctiva clear; pupils round and reactive to light; extraocular movements intact  Ears: External normal; canals clear; tympanic  membranes normal  Oropharynx: Pink, supple. No suspicious lesions  Neck: Supple without thyromegaly, adenopathy  Lungs: Respirations unlabored; clear to auscultation bilaterally without wheeze, rales, rhonchi  Neurologic: Alert and oriented; speech intact; face symmetrical; moves all extremities well; CNII-XII intact without focal deficit   Assessment:  1. Tinnitus of both ears   2. Abnormal laboratory test result     Plan:  1. Referral to ENT for further evaluation; 2. Update labs today- no prior history of gout or kidney disease; discussed that level noted in study may be "normal" for her; follow up to be determined; 3. She will let me know what labs needs to be done in preparation for knee surgery; will plan to order labs at [facility] location;   Time spent 30 minutes- coordinating care and discussing treatment plan  This visit occurred during the SARS-CoV-2 public health emergency.  Safety protocols were in place, including screening questions prior to the visit, additional usage of staff PPE, and extensive cleaning of exam room while observing appropriate contact time as indicated for disinfecting solutions.      No follow-ups on file.  Orders Placed This Encounter  Procedures  . Comp Met (CMET)  . Uric acid  . Ambulatory referral to ENT    Referral Priority:   Routine    Referral Type:   Consultation    Referral Reason:   Specialty Services Required    Requested Specialty:   Otolaryngology    Number of Visits Requested:   1    Requested Prescriptions    No prescriptions requested or ordered in this encounter

## 2021-05-03 ENCOUNTER — Encounter: Payer: Self-pay | Admitting: Family

## 2021-05-04 ENCOUNTER — Other Ambulatory Visit: Payer: Self-pay | Admitting: Family

## 2021-05-04 DIAGNOSIS — R5383 Other fatigue: Secondary | ICD-10-CM

## 2021-05-04 NOTE — Telephone Encounter (Signed)
I have pended cmp, cbc and placed as future. Ok for patient to go to [place] at convenience?

## 2021-05-04 NOTE — Telephone Encounter (Signed)
Please advise on lab orders? None are in as future. If appropriate I will get patient scheduled at [facility] or can she walk in?

## 2021-05-11 ENCOUNTER — Other Ambulatory Visit (INDEPENDENT_AMBULATORY_CARE_PROVIDER_SITE_OTHER): Payer: BC Managed Care – PPO

## 2021-05-11 DIAGNOSIS — R5383 Other fatigue: Secondary | ICD-10-CM

## 2021-05-11 LAB — CBC WITH DIFFERENTIAL/PLATELET
Basophils Absolute: 0 10*3/uL (ref 0.0–0.1)
Basophils Relative: 0.3 % (ref 0.0–3.0)
Eosinophils Absolute: 0.2 10*3/uL (ref 0.0–0.7)
Eosinophils Relative: 2.1 % (ref 0.0–5.0)
HCT: 39.8 % (ref 36.0–46.0)
Hemoglobin: 13.7 g/dL (ref 12.0–15.0)
Lymphocytes Relative: 38.3 % (ref 12.0–46.0)
Lymphs Abs: 3.9 10*3/uL (ref 0.7–4.0)
MCHC: 34.4 g/dL (ref 30.0–36.0)
MCV: 84 fl (ref 78.0–100.0)
Monocytes Absolute: 0.6 10*3/uL (ref 0.1–1.0)
Monocytes Relative: 5.7 % (ref 3.0–12.0)
Neutro Abs: 5.5 10*3/uL (ref 1.4–7.7)
Neutrophils Relative %: 53.6 % (ref 43.0–77.0)
Platelets: 302 10*3/uL (ref 150.0–400.0)
RBC: 4.74 Mil/uL (ref 3.87–5.11)
RDW: 13 % (ref 11.5–15.5)
WBC: 10.2 10*3/uL (ref 4.0–10.5)

## 2021-05-11 LAB — COMPREHENSIVE METABOLIC PANEL
ALT: 23 U/L (ref 0–35)
AST: 19 U/L (ref 0–37)
Albumin: 4.6 g/dL (ref 3.5–5.2)
Alkaline Phosphatase: 65 U/L (ref 39–117)
BUN: 15 mg/dL (ref 6–23)
CO2: 25 mEq/L (ref 19–32)
Calcium: 10 mg/dL (ref 8.4–10.5)
Chloride: 101 mEq/L (ref 96–112)
Creatinine, Ser: 0.79 mg/dL (ref 0.40–1.20)
GFR: 79.65 mL/min (ref >60.00)
Glucose, Bld: 91 mg/dL (ref 70–99)
Potassium: 4.2 mEq/L (ref 3.5–5.1)
Sodium: 135 mEq/L (ref 135–145)
Total Bilirubin: 0.4 mg/dL (ref 0.2–1.2)
Total Protein: 8.1 g/dL (ref 6.0–8.3)

## 2021-05-24 DIAGNOSIS — H9313 Tinnitus, bilateral: Secondary | ICD-10-CM | POA: Diagnosis not present

## 2021-05-24 DIAGNOSIS — H903 Sensorineural hearing loss, bilateral: Secondary | ICD-10-CM | POA: Diagnosis not present

## 2021-06-01 ENCOUNTER — Other Ambulatory Visit: Payer: Self-pay

## 2021-06-01 ENCOUNTER — Ambulatory Visit
Admission: RE | Admit: 2021-06-01 | Discharge: 2021-06-01 | Disposition: A | Discharge status: Home / Self Care | Payer: BC Managed Care – PPO | Source: Ambulatory Visit | Attending: Obstetrics and Gynecology | Admitting: Obstetrics and Gynecology

## 2021-06-01 DIAGNOSIS — R922 Inconclusive mammogram: Secondary | ICD-10-CM | POA: Diagnosis not present

## 2021-06-01 DIAGNOSIS — R928 Other abnormal and inconclusive findings on diagnostic imaging of breast: Secondary | ICD-10-CM

## 2021-06-07 ENCOUNTER — Telehealth: Payer: Self-pay | Admitting: Family

## 2021-06-07 NOTE — Telephone Encounter (Signed)
Results refaxed to Dr. Ronnie Derby.

## 2021-06-07 NOTE — Telephone Encounter (Signed)
Dr. Jeoffrey Massed office states they never got lab results it should you sent on 05/14/21. I verified fax and it was incorrect. It should be  [phone]

## 2021-06-18 DIAGNOSIS — M1711 Unilateral primary osteoarthritis, right knee: Secondary | ICD-10-CM | POA: Diagnosis not present

## 2021-06-25 DIAGNOSIS — R531 Weakness: Secondary | ICD-10-CM | POA: Diagnosis not present

## 2021-06-25 DIAGNOSIS — M25661 Stiffness of right knee, not elsewhere classified: Secondary | ICD-10-CM | POA: Diagnosis not present

## 2021-06-25 DIAGNOSIS — M25561 Pain in right knee: Secondary | ICD-10-CM | POA: Diagnosis not present

## 2021-06-25 DIAGNOSIS — Z96651 Presence of right artificial knee joint: Secondary | ICD-10-CM | POA: Diagnosis not present

## 2021-06-26 DIAGNOSIS — Z96651 Presence of right artificial knee joint: Secondary | ICD-10-CM | POA: Diagnosis not present

## 2021-06-28 DIAGNOSIS — Z96651 Presence of right artificial knee joint: Secondary | ICD-10-CM | POA: Diagnosis not present

## 2021-06-28 DIAGNOSIS — R531 Weakness: Secondary | ICD-10-CM | POA: Diagnosis not present

## 2021-06-28 DIAGNOSIS — M25561 Pain in right knee: Secondary | ICD-10-CM | POA: Diagnosis not present

## 2021-06-28 DIAGNOSIS — M25661 Stiffness of right knee, not elsewhere classified: Secondary | ICD-10-CM | POA: Diagnosis not present

## 2021-07-02 DIAGNOSIS — M25561 Pain in right knee: Secondary | ICD-10-CM | POA: Diagnosis not present

## 2021-07-02 DIAGNOSIS — R531 Weakness: Secondary | ICD-10-CM | POA: Diagnosis not present

## 2021-07-02 DIAGNOSIS — M25661 Stiffness of right knee, not elsewhere classified: Secondary | ICD-10-CM | POA: Diagnosis not present

## 2021-07-02 DIAGNOSIS — Z96651 Presence of right artificial knee joint: Secondary | ICD-10-CM | POA: Diagnosis not present

## 2021-07-04 DIAGNOSIS — M25661 Stiffness of right knee, not elsewhere classified: Secondary | ICD-10-CM | POA: Diagnosis not present

## 2021-07-04 DIAGNOSIS — Z96651 Presence of right artificial knee joint: Secondary | ICD-10-CM | POA: Diagnosis not present

## 2021-07-04 DIAGNOSIS — M25561 Pain in right knee: Secondary | ICD-10-CM | POA: Diagnosis not present

## 2021-07-04 DIAGNOSIS — R531 Weakness: Secondary | ICD-10-CM | POA: Diagnosis not present

## 2021-07-09 DIAGNOSIS — M25661 Stiffness of right knee, not elsewhere classified: Secondary | ICD-10-CM | POA: Diagnosis not present

## 2021-07-09 DIAGNOSIS — R531 Weakness: Secondary | ICD-10-CM | POA: Diagnosis not present

## 2021-07-09 DIAGNOSIS — M25561 Pain in right knee: Secondary | ICD-10-CM | POA: Diagnosis not present

## 2021-07-09 DIAGNOSIS — Z96651 Presence of right artificial knee joint: Secondary | ICD-10-CM | POA: Diagnosis not present

## 2021-07-11 DIAGNOSIS — M25561 Pain in right knee: Secondary | ICD-10-CM | POA: Diagnosis not present

## 2021-07-11 DIAGNOSIS — M25661 Stiffness of right knee, not elsewhere classified: Secondary | ICD-10-CM | POA: Diagnosis not present

## 2021-07-11 DIAGNOSIS — R531 Weakness: Secondary | ICD-10-CM | POA: Diagnosis not present

## 2021-07-11 DIAGNOSIS — Z96651 Presence of right artificial knee joint: Secondary | ICD-10-CM | POA: Diagnosis not present

## 2021-07-16 DIAGNOSIS — M25661 Stiffness of right knee, not elsewhere classified: Secondary | ICD-10-CM | POA: Diagnosis not present

## 2021-07-16 DIAGNOSIS — R531 Weakness: Secondary | ICD-10-CM | POA: Diagnosis not present

## 2021-07-16 DIAGNOSIS — Z96651 Presence of right artificial knee joint: Secondary | ICD-10-CM | POA: Diagnosis not present

## 2021-07-16 DIAGNOSIS — M25561 Pain in right knee: Secondary | ICD-10-CM | POA: Diagnosis not present

## 2021-07-18 DIAGNOSIS — M25561 Pain in right knee: Secondary | ICD-10-CM | POA: Diagnosis not present

## 2021-07-18 DIAGNOSIS — R531 Weakness: Secondary | ICD-10-CM | POA: Diagnosis not present

## 2021-07-18 DIAGNOSIS — Z96651 Presence of right artificial knee joint: Secondary | ICD-10-CM | POA: Diagnosis not present

## 2021-07-18 DIAGNOSIS — M25661 Stiffness of right knee, not elsewhere classified: Secondary | ICD-10-CM | POA: Diagnosis not present

## 2021-07-25 DIAGNOSIS — M25661 Stiffness of right knee, not elsewhere classified: Secondary | ICD-10-CM | POA: Diagnosis not present

## 2021-07-25 DIAGNOSIS — Z96651 Presence of right artificial knee joint: Secondary | ICD-10-CM | POA: Diagnosis not present

## 2021-07-25 DIAGNOSIS — M25561 Pain in right knee: Secondary | ICD-10-CM | POA: Diagnosis not present

## 2021-07-25 DIAGNOSIS — R531 Weakness: Secondary | ICD-10-CM | POA: Diagnosis not present

## 2021-08-01 DIAGNOSIS — M25661 Stiffness of right knee, not elsewhere classified: Secondary | ICD-10-CM | POA: Diagnosis not present

## 2021-08-01 DIAGNOSIS — M25561 Pain in right knee: Secondary | ICD-10-CM | POA: Diagnosis not present

## 2021-08-01 DIAGNOSIS — R531 Weakness: Secondary | ICD-10-CM | POA: Diagnosis not present

## 2021-08-01 DIAGNOSIS — Z96651 Presence of right artificial knee joint: Secondary | ICD-10-CM | POA: Diagnosis not present

## 2021-08-02 DIAGNOSIS — M25661 Stiffness of right knee, not elsewhere classified: Secondary | ICD-10-CM | POA: Diagnosis not present

## 2021-08-02 DIAGNOSIS — Z96651 Presence of right artificial knee joint: Secondary | ICD-10-CM | POA: Diagnosis not present

## 2021-08-02 DIAGNOSIS — R531 Weakness: Secondary | ICD-10-CM | POA: Diagnosis not present

## 2021-08-02 DIAGNOSIS — M25561 Pain in right knee: Secondary | ICD-10-CM | POA: Diagnosis not present

## 2021-08-03 DIAGNOSIS — H43812 Vitreous degeneration, left eye: Secondary | ICD-10-CM | POA: Diagnosis not present

## 2021-08-06 DIAGNOSIS — M25661 Stiffness of right knee, not elsewhere classified: Secondary | ICD-10-CM | POA: Diagnosis not present

## 2021-08-06 DIAGNOSIS — R531 Weakness: Secondary | ICD-10-CM | POA: Diagnosis not present

## 2021-08-06 DIAGNOSIS — M25561 Pain in right knee: Secondary | ICD-10-CM | POA: Diagnosis not present

## 2021-08-06 DIAGNOSIS — Z96651 Presence of right artificial knee joint: Secondary | ICD-10-CM | POA: Diagnosis not present

## 2021-08-08 DIAGNOSIS — Z96651 Presence of right artificial knee joint: Secondary | ICD-10-CM | POA: Diagnosis not present

## 2021-08-08 DIAGNOSIS — M25661 Stiffness of right knee, not elsewhere classified: Secondary | ICD-10-CM | POA: Diagnosis not present

## 2021-08-08 DIAGNOSIS — R531 Weakness: Secondary | ICD-10-CM | POA: Diagnosis not present

## 2021-08-08 DIAGNOSIS — M25561 Pain in right knee: Secondary | ICD-10-CM | POA: Diagnosis not present

## 2021-08-13 DIAGNOSIS — M25561 Pain in right knee: Secondary | ICD-10-CM | POA: Diagnosis not present

## 2021-08-13 DIAGNOSIS — M25661 Stiffness of right knee, not elsewhere classified: Secondary | ICD-10-CM | POA: Diagnosis not present

## 2021-08-13 DIAGNOSIS — Z96651 Presence of right artificial knee joint: Secondary | ICD-10-CM | POA: Diagnosis not present

## 2021-08-13 DIAGNOSIS — R531 Weakness: Secondary | ICD-10-CM | POA: Diagnosis not present

## 2021-08-16 DIAGNOSIS — Z96651 Presence of right artificial knee joint: Secondary | ICD-10-CM | POA: Diagnosis not present

## 2021-08-16 DIAGNOSIS — R531 Weakness: Secondary | ICD-10-CM | POA: Diagnosis not present

## 2021-08-16 DIAGNOSIS — M25661 Stiffness of right knee, not elsewhere classified: Secondary | ICD-10-CM | POA: Diagnosis not present

## 2021-08-16 DIAGNOSIS — M25561 Pain in right knee: Secondary | ICD-10-CM | POA: Diagnosis not present

## 2021-08-20 DIAGNOSIS — R531 Weakness: Secondary | ICD-10-CM | POA: Diagnosis not present

## 2021-08-20 DIAGNOSIS — M25661 Stiffness of right knee, not elsewhere classified: Secondary | ICD-10-CM | POA: Diagnosis not present

## 2021-08-20 DIAGNOSIS — M25561 Pain in right knee: Secondary | ICD-10-CM | POA: Diagnosis not present

## 2021-08-20 DIAGNOSIS — Z96651 Presence of right artificial knee joint: Secondary | ICD-10-CM | POA: Diagnosis not present

## 2021-08-22 DIAGNOSIS — M25561 Pain in right knee: Secondary | ICD-10-CM | POA: Diagnosis not present

## 2021-08-22 DIAGNOSIS — Z96651 Presence of right artificial knee joint: Secondary | ICD-10-CM | POA: Diagnosis not present

## 2021-08-22 DIAGNOSIS — R531 Weakness: Secondary | ICD-10-CM | POA: Diagnosis not present

## 2021-08-22 DIAGNOSIS — M25661 Stiffness of right knee, not elsewhere classified: Secondary | ICD-10-CM | POA: Diagnosis not present

## 2021-08-27 DIAGNOSIS — M25561 Pain in right knee: Secondary | ICD-10-CM | POA: Diagnosis not present

## 2021-08-27 DIAGNOSIS — M25661 Stiffness of right knee, not elsewhere classified: Secondary | ICD-10-CM | POA: Diagnosis not present

## 2021-08-27 DIAGNOSIS — Z96651 Presence of right artificial knee joint: Secondary | ICD-10-CM | POA: Diagnosis not present

## 2021-08-27 DIAGNOSIS — R531 Weakness: Secondary | ICD-10-CM | POA: Diagnosis not present

## 2021-08-29 DIAGNOSIS — M25661 Stiffness of right knee, not elsewhere classified: Secondary | ICD-10-CM | POA: Diagnosis not present

## 2021-08-29 DIAGNOSIS — M25561 Pain in right knee: Secondary | ICD-10-CM | POA: Diagnosis not present

## 2021-08-29 DIAGNOSIS — Z96651 Presence of right artificial knee joint: Secondary | ICD-10-CM | POA: Diagnosis not present

## 2021-08-29 DIAGNOSIS — R531 Weakness: Secondary | ICD-10-CM | POA: Diagnosis not present

## 2021-09-05 DIAGNOSIS — M25561 Pain in right knee: Secondary | ICD-10-CM | POA: Diagnosis not present

## 2021-09-05 DIAGNOSIS — Z96651 Presence of right artificial knee joint: Secondary | ICD-10-CM | POA: Diagnosis not present

## 2021-09-05 DIAGNOSIS — M25661 Stiffness of right knee, not elsewhere classified: Secondary | ICD-10-CM | POA: Diagnosis not present

## 2021-09-05 DIAGNOSIS — R531 Weakness: Secondary | ICD-10-CM | POA: Diagnosis not present

## 2021-09-06 DIAGNOSIS — Z96651 Presence of right artificial knee joint: Secondary | ICD-10-CM | POA: Diagnosis not present

## 2021-09-06 DIAGNOSIS — R531 Weakness: Secondary | ICD-10-CM | POA: Diagnosis not present

## 2021-09-06 DIAGNOSIS — M25661 Stiffness of right knee, not elsewhere classified: Secondary | ICD-10-CM | POA: Diagnosis not present

## 2021-09-06 DIAGNOSIS — M25561 Pain in right knee: Secondary | ICD-10-CM | POA: Diagnosis not present

## 2021-09-10 DIAGNOSIS — M25661 Stiffness of right knee, not elsewhere classified: Secondary | ICD-10-CM | POA: Diagnosis not present

## 2021-09-10 DIAGNOSIS — R531 Weakness: Secondary | ICD-10-CM | POA: Diagnosis not present

## 2021-09-10 DIAGNOSIS — M25561 Pain in right knee: Secondary | ICD-10-CM | POA: Diagnosis not present

## 2021-09-10 DIAGNOSIS — Z96651 Presence of right artificial knee joint: Secondary | ICD-10-CM | POA: Diagnosis not present

## 2021-10-04 DIAGNOSIS — Z96651 Presence of right artificial knee joint: Secondary | ICD-10-CM | POA: Diagnosis not present

## 2021-10-30 ENCOUNTER — Other Ambulatory Visit: Payer: Self-pay

## 2021-10-30 MED ORDER — HUMIRA PEN 40 MG/0.8ML ~~LOC~~ PNKT: PEN_INJECTOR | SUBCUTANEOUS | 1 refills | Status: DC

## 2021-11-14 ENCOUNTER — Telehealth: Payer: Self-pay

## 2021-11-14 NOTE — Telephone Encounter (Signed)
Spoke with pt and let her know we will refill her Humira as she has an OV scheduled for 12/07/21. Refill faxed to phamacy.

## 2021-12-06 ENCOUNTER — Ambulatory Visit (INDEPENDENT_AMBULATORY_CARE_PROVIDER_SITE_OTHER): Payer: BC Managed Care – PPO | Admitting: Physician Assistant

## 2021-12-06 ENCOUNTER — Other Ambulatory Visit (INDEPENDENT_AMBULATORY_CARE_PROVIDER_SITE_OTHER): Payer: BC Managed Care – PPO

## 2021-12-06 ENCOUNTER — Encounter: Payer: Self-pay | Admitting: Physician Assistant

## 2021-12-06 VITALS — BP 104/64 | HR 90

## 2021-12-06 DIAGNOSIS — K50119 Crohn's disease of large intestine with unspecified complications: Secondary | ICD-10-CM

## 2021-12-06 DIAGNOSIS — R109 Unspecified abdominal pain: Secondary | ICD-10-CM | POA: Diagnosis not present

## 2021-12-06 DIAGNOSIS — Z79620 Long term (current) use of immunosuppressive biologic: Secondary | ICD-10-CM | POA: Diagnosis not present

## 2021-12-06 DIAGNOSIS — Z9225 Personal history of immunosupression therapy: Secondary | ICD-10-CM

## 2021-12-06 LAB — COMPREHENSIVE METABOLIC PANEL
ALT: 27 U/L (ref 0–35)
AST: 20 U/L (ref 0–37)
Albumin: 4.4 g/dL (ref 3.5–5.2)
Alkaline Phosphatase: 59 U/L (ref 39–117)
BUN: 16 mg/dL (ref 6–23)
CO2: 29 mEq/L (ref 19–32)
Calcium: 10.1 mg/dL (ref 8.4–10.5)
Chloride: 102 mEq/L (ref 96–112)
Creatinine, Ser: 1.04 mg/dL (ref 0.40–1.20)
GFR: 57.04 mL/min — ABNORMAL LOW (ref >60.00)
Glucose, Bld: 88 mg/dL (ref 70–99)
Potassium: 4.4 mEq/L (ref 3.5–5.1)
Sodium: 138 mEq/L (ref 135–145)
Total Bilirubin: 0.4 mg/dL (ref 0.2–1.2)
Total Protein: 7.7 g/dL (ref 6.0–8.3)

## 2021-12-06 LAB — CBC WITH DIFFERENTIAL/PLATELET
Basophils Absolute: 0 10*3/uL (ref 0.0–0.1)
Basophils Relative: 0.4 % (ref 0.0–3.0)
Eosinophils Absolute: 0.2 10*3/uL (ref 0.0–0.7)
Eosinophils Relative: 2.1 % (ref 0.0–5.0)
HCT: 40.5 % (ref 36.0–46.0)
Hemoglobin: 13.1 g/dL (ref 12.0–15.0)
Lymphocytes Relative: 45.3 % (ref 12.0–46.0)
Lymphs Abs: 3.8 10*3/uL (ref 0.7–4.0)
MCHC: 32.2 g/dL (ref 30.0–36.0)
MCV: 85.7 fl (ref 78.0–100.0)
Monocytes Absolute: 0.5 10*3/uL (ref 0.1–1.0)
Monocytes Relative: 6.4 % (ref 3.0–12.0)
Neutro Abs: 3.8 10*3/uL (ref 1.4–7.7)
Neutrophils Relative %: 45.8 % (ref 43.0–77.0)
Platelets: 287 10*3/uL (ref 150.0–400.0)
RBC: 4.73 Mil/uL (ref 3.87–5.11)
RDW: 13.7 % (ref 11.5–15.5)
WBC: 8.4 10*3/uL (ref 4.0–10.5)

## 2021-12-06 LAB — SEDIMENTATION RATE: Sed Rate: 32 mm/hr — ABNORMAL HIGH (ref 0–30)

## 2021-12-06 LAB — C-REACTIVE PROTEIN: CRP: <1 mg/dL (ref 0.5–20.0)

## 2021-12-06 MED ORDER — HUMIRA PEN 40 MG/0.8ML ~~LOC~~ PNKT: PEN_INJECTOR | SUBCUTANEOUS | 11 refills | Status: DC

## 2021-12-06 NOTE — Progress Notes (Signed)
Subjective:    Patient ID: [name] [name], female    DOB: 1956-06-08, 66 y.o.   MRN: [id number]  HPI [name] [name] is a 66 year old female, known to Dr. Henrene Pastor with history of Crohn's colitis who comes in today for follow-up.  She has been maintained on Humira 40 mg subcu q. 14 days over the past several years. Last office visit was in September 2020.  She last had colonoscopy in July 2019 with finding of sigmoid diverticulosis and evidence of prior segmental resection, no evidence of active Crohn's. Patient had undergone a segmental bowel resection in 2016 per Dr. Excell Seltzer for distal transverse colon stricture.  She says had been doing well over the past year and a half, has been very vigilant about taking the Humira.  She says that since November 2022 she has developed some discomfort in the left abdomen and at one point was also feeling pain wrapping around into the left flank and back.  She has had some ongoing discomfort on that left side over the past 2 months, no associated fever or chills, no nausea or vomiting.  For the most part bowel movements have been normal, she did have a couple of days of diarrhea but that did not persist.  No melena or hematochezia.  Most recent labs and bid done in June 2022 by PCP with hemoglobin 13.7 hematocrit 39 MCV of 84 and LFTs were within normal limits.  Review of Systems.Pertinent positive and negative review of systems were noted in the above HPI section.  All other review of systems was otherwise negative.   Outpatient Encounter Medications as of 12/06/2021  Medication Sig   calcium-vitamin D 250-100 MG-UNIT per tablet Take 1 tablet by mouth 2 (two) times daily.   carboxymethylcellulose (REFRESH PLUS) 0.5 % SOLN Place 1 drop into both eyes 3 (three) times daily as needed (Dry eyes).   Multiple Vitamin (MULTIVITAMIN) tablet Take 1 tablet by mouth daily.   vitamin B-12 (CYANOCOBALAMIN) 100 MCG tablet Take 50 mcg by mouth daily.   [DISCONTINUED] Adalimumab  (HUMIRA PEN) 40 MG/0.8ML PNKT INJECT 1 PEN UNDER THE SKIN (SUBCUTANEOUS INJECTION) EVERY 14 DAYS   Adalimumab (HUMIRA PEN) 40 MG/0.8ML PNKT INJECT 1 PEN UNDER THE SKIN (SUBCUTANEOUS INJECTION) EVERY 14 DAYS   No facility-administered encounter medications on file as of 12/06/2021.   No Known Allergies Patient Active Problem List   Diagnosis Date Noted   Abnormal REM sleep 02/29/2020   Morbid obesity ([place]) 02/03/2020   Sleep apnea 02/03/2020   Loud snoring 02/03/2020   Crohn's disease without complication ([place]) 94/49/6759   Crohn's colitis ([place]) 02/08/2015   DIARRHEA 01/03/2011   ABNORMAL TRANSAMINASE, (LFT'S) 06/07/2009   CROHN'S DISEASE-LARGE INTESTINE 06/06/2009   CROHN'S DISEASE 03/08/2009   Social History   Socioeconomic History   Marital status: Married    Spouse name: Not on file   Number of children: 0   Years of education: Not on file   Highest education level: Not on file  Occupational History   Occupation: over the road truck driver  Tobacco Use   Smoking status: Former    Packs/day: 2.00    Years: 10.00    Pack years: 20.00    Types: Cigarettes    Quit date: 08/24/1988    Years since quitting: 33.3   Smokeless tobacco: Never  Vaping Use   Vaping Use: Never used  Substance and Sexual Activity   Alcohol use: Yes    Alcohol/week: 2.0 - 3.0 standard drinks  Types: 2 - 3 Standard drinks or equivalent per week    Comment: social   Drug use: No   Sexual activity: Not on file  Other Topics Concern   Not on file  Social History Narrative   Not on file   Social Determinants of Health   Financial Resource Strain: Not on file  Food Insecurity: Not on file  Transportation Needs: Not on file  Physical Activity: Not on file  Stress: Not on file  Social Connections: Not on file  Intimate Partner Violence: Not on file    [name] [name]'s family history includes Breast cancer in her mother and paternal aunt; Cancer in her father; Diabetes in her brother, maternal  grandfather, and mother; Heart disease in her maternal grandfather and paternal grandfather; Lung cancer in her father; Stomach cancer in an other family member.      Objective:    Vitals:   12/06/21 1032  BP: 104/64  Pulse: 90  SpO2: 97%    Physical Exam Well-developed well-nourished  white female in no acute distress.  Height, Weight, 197 BMI   HEENT; nontraumatic normocephalic, EOMI, PE R LA, sclera anicteric. Oropharynx; Neck; supple, no JVD Cardiovascular; regular rate and rhythm with S1-S2, no murmur rub or gallop Pulmonary; Clear bilaterally Abdomen; soft, there is tenderness in the left mid quadrant and left lower quadrant no guarding or rebound ,nondistended, no palpable mass or hepatosplenomegaly, bowel sounds are active Rectal; not done today Skin; benign exam, no jaundice rash or appreciable lesions Extremities; no clubbing cyanosis or edema skin warm and dry Neuro/Psych; alert and oriented x4, grossly nonfocal mood and affect appropriate        Assessment & Plan:   #70 66 year old white female with history of Crohn's colitis status post segmental resection for transverse colon stricture 2016, who has been felt to be in remission on Humira over the past 6 years. Patient has developed new left-sided abdominal discomfort over the past 2 months which is persisting, has had some radiation around into the left flank and back.  No change in bowel habits, no melena or hematochezia, no nausea or vomiting, no persistent diarrhea and for the most part normal bowel movements. She has tenderness in the left mid quadrant and left lower quadrant exam today  Am concerned that she is having an exacerbation of the Crohn's colitis  #2 diverticulosis  Plan; CBC with differential, c-Met, CRP, fecal calprotectin Discussed indication for cross-sectional imaging versus repeat colonoscopy to reassess for activity of Crohn's.  She will not have sufficient insurance until early March, due to  extremely high co-pay etc. She wants to wait until March before proceeding with CT or colonoscopy. For that reason will also check adalimumab antibody level today (last dose was about 6 days ago) so will not be a real trough level I discussed trial of steroids.  She has had extremely poor tolerance to steroids in the past. Will await results of above labs, I have asked her to call should she have any progression in symptoms over the next weeks. Once labs have resulted then will decide regarding proceeding with colonoscopy which could be scheduled for early March versus CT.  Ethelwyn Gilbertson S Desirai Traxler PA-C 12/06/2021   Cc: Marrian Salvage,*

## 2021-12-06 NOTE — Patient Instructions (Addendum)
If you are age 64 or younger, your body mass index should be between 19-25. Your There is no height or weight on file to calculate BMI. If this is out of the aformentioned range listed, please consider follow up with your Primary Care Provider.  ________________________________________________________  The [facility] providers would like to encourage you to use [facility] to communicate with providers for non-urgent requests or questions.  Due to long hold times on the telephone, sending your provider a message by [facility] may be a faster and more efficient way to get a response.  Please allow 48 business hours for a response.  Please remember that this is for non-urgent requests.  _______________________________________________________  Your provider has requested that you go to the basement level for lab work before leaving today. Press "B" on the elevator. The lab is located at the first door on the left as you exit the elevator.  Follow up pending at this time.  Thank you for entrusting me with your care and choosing [facility].  Amy Esterwood, PA-C

## 2021-12-07 NOTE — Progress Notes (Signed)
Reviewed. Plan noted

## 2021-12-11 ENCOUNTER — Other Ambulatory Visit: Payer: Self-pay | Admitting: Internal Medicine

## 2021-12-11 LAB — QUANTIFERON-TB GOLD PLUS
Mitogen-NIL: >10 IU/mL
NIL: 0.12 IU/mL
QuantiFERON-TB Gold Plus: NEGATIVE
TB1-NIL: <0 IU/mL
TB2-NIL: <0 IU/mL

## 2021-12-13 ENCOUNTER — Other Ambulatory Visit: Payer: BC Managed Care – PPO

## 2021-12-13 DIAGNOSIS — K50119 Crohn's disease of large intestine with unspecified complications: Secondary | ICD-10-CM

## 2021-12-13 DIAGNOSIS — Z9225 Personal history of immunosupression therapy: Secondary | ICD-10-CM | POA: Diagnosis not present

## 2021-12-13 DIAGNOSIS — R109 Unspecified abdominal pain: Secondary | ICD-10-CM

## 2021-12-14 LAB — SERIAL MONITORING

## 2021-12-16 LAB — CALPROTECTIN, FECAL: Calprotectin, Fecal: <16 ug/g (ref 0–120)

## 2021-12-16 LAB — SPECIMEN STATUS REPORT

## 2021-12-17 LAB — ADALIMUMAB+AB (SERIAL MONITOR)
Adalimumab Drug Level: 5.3 ug/mL
Anti-Adalimumab Antibody: <25 ng/mL

## 2022-01-06 ENCOUNTER — Other Ambulatory Visit: Payer: Self-pay | Admitting: Internal Medicine

## 2022-01-17 ENCOUNTER — Other Ambulatory Visit: Payer: Self-pay

## 2022-01-17 MED ORDER — HUMIRA (2 PEN) 40 MG/0.4ML ~~LOC~~ AJKT
40.0000 mg | AUTO-INJECTOR | SUBCUTANEOUS | 11 refills | Status: DC
Start: 2022-01-17 — End: 2022-12-20

## 2022-01-30 ENCOUNTER — Other Ambulatory Visit: Payer: Self-pay | Admitting: Internal Medicine

## 2022-01-31 DIAGNOSIS — H25013 Cortical age-related cataract, bilateral: Secondary | ICD-10-CM | POA: Diagnosis not present

## 2022-01-31 DIAGNOSIS — H2511 Age-related nuclear cataract, right eye: Secondary | ICD-10-CM | POA: Diagnosis not present

## 2022-01-31 DIAGNOSIS — H2513 Age-related nuclear cataract, bilateral: Secondary | ICD-10-CM | POA: Diagnosis not present

## 2022-01-31 DIAGNOSIS — H18413 Arcus senilis, bilateral: Secondary | ICD-10-CM | POA: Diagnosis not present

## 2022-01-31 DIAGNOSIS — H353131 Nonexudative age-related macular degeneration, bilateral, early dry stage: Secondary | ICD-10-CM | POA: Diagnosis not present

## 2022-02-01 ENCOUNTER — Other Ambulatory Visit: Payer: Self-pay | Admitting: Internal Medicine

## 2022-03-08 DIAGNOSIS — H35371 Puckering of macula, right eye: Secondary | ICD-10-CM | POA: Diagnosis not present

## 2022-03-08 DIAGNOSIS — H43813 Vitreous degeneration, bilateral: Secondary | ICD-10-CM | POA: Diagnosis not present

## 2022-03-08 DIAGNOSIS — Q143 Congenital malformation of choroid: Secondary | ICD-10-CM | POA: Diagnosis not present

## 2022-03-08 DIAGNOSIS — H353132 Nonexudative age-related macular degeneration, bilateral, intermediate dry stage: Secondary | ICD-10-CM | POA: Diagnosis not present

## 2022-03-19 ENCOUNTER — Telehealth: Payer: Self-pay | Admitting: Family

## 2022-03-19 NOTE — Telephone Encounter (Signed)
I have called the pt to gather more information. She stated that this is on going chest pressure for the last 2 years off and on and wants to make an appointment. Appointment scheduled for 04/02/22.  ?

## 2022-03-19 NOTE — Telephone Encounter (Signed)
Pt called stating she would like a referral to a cardiologist to get checked out. She said she would like to see [facility] if possible. Please Advise.  ?

## 2022-03-20 ENCOUNTER — Other Ambulatory Visit: Payer: Self-pay | Admitting: Family

## 2022-03-20 DIAGNOSIS — R0789 Other chest pain: Secondary | ICD-10-CM

## 2022-03-21 ENCOUNTER — Ambulatory Visit: Payer: BC Managed Care – PPO | Admitting: Internal Medicine

## 2022-03-25 ENCOUNTER — Ambulatory Visit (INDEPENDENT_AMBULATORY_CARE_PROVIDER_SITE_OTHER): Payer: BC Managed Care – PPO | Admitting: Internal Medicine

## 2022-03-25 ENCOUNTER — Encounter: Payer: Self-pay | Admitting: Internal Medicine

## 2022-03-25 VITALS — BP 124/66 | HR 79 | Ht 63.0 in | Wt 204.2 lb

## 2022-03-25 DIAGNOSIS — R072 Precordial pain: Secondary | ICD-10-CM

## 2022-03-25 LAB — BASIC METABOLIC PANEL
BUN/Creatinine Ratio: 20 (ref 12–28)
BUN: 14 mg/dL (ref 8–27)
CO2: 25 mmol/L (ref 20–29)
Calcium: 10 mg/dL (ref 8.7–10.3)
Chloride: 102 mmol/L (ref 96–106)
Creatinine, Ser: 0.7 mg/dL (ref 0.57–1.00)
Glucose: 93 mg/dL (ref 70–99)
Potassium: 4.5 mmol/L (ref 3.5–5.2)
Sodium: 138 mmol/L (ref 134–144)
eGFR: 97 mL/min/{1.73_m2} (ref >59)

## 2022-03-25 MED ORDER — METOPROLOL TARTRATE 100 MG PO TABS: 100.0000 mg | ORAL_TABLET | Freq: Once | ORAL | 0 refills | Status: AC

## 2022-03-25 NOTE — Patient Instructions (Signed)
Medication Instructions:  ?PLEASE TAKE METOPROLOL TARTRATE 122m (2) HOURS PRIOR TO CCTA SCAN  ?*If you need a refill on your cardiac medications before your next appointment, please call your pharmacy* ? ?Lab Work: ?BMET-TODAY  ?If you have labs (blood work) drawn today and your tests are completely normal, you will receive your results only by: ?MyChart Message (if you have MyChart) OR ?A paper copy in the mail ?If you have any lab test that is abnormal or we need to change your treatment, we will call you to review the results. ? ?Testing/Procedures: ?Your physician has requested that you have cardiac CT. Cardiac computed tomography (CT) is a painless test that uses an x-ray machine to take clear, detailed pictures of your heart. For further information please visit [email or web address] Please follow instruction sheet as given. ? ? ?Follow-Up: ?[facility] and your health needs are our priority.  As part of our continuing mission to provide you with exceptional heart care, we have created designated Provider Care Teams.  These Care Teams include your primary Cardiologist (physician) and Advanced Practice Providers (APPs -  Physician Assistants and Nurse Practitioners) who all work together to provide you with the care you need, when you need it. ? ?Your next appointment:   ?AS NEEDED  ? ?The format for your next appointment:   ?In Person ? ?Provider:   ?BJanina Mayo MD   ? ?Other Instructions ? ? ?Your cardiac CT will be scheduled at one of the below locations:  ? ?[facility]?[address]?[place] [address]?[phone] ? ?[facility] minutes prior to test start time. ?You can use the FREE valet parking offered at entrance C (encouraged to control the heart rate for the test)  ?[facility] (first floor) to check-in and  test prep. ? ?All radiology patients and guests should use [facility] C2 at [facility] even though the hospital's physical address listed is [address] ? ? ? ? ?Please follow these instructions carefully (unless otherwise directed): ? ?On the Night Before the Test: ?Be sure to Drink plenty of water. ?Do not consume any caffeinated/decaffeinated beverages or chocolate 12 hours prior to your test. ?Do not take any antihistamines 12 hours prior to your test. ? ?On the Day of the Test: ?Drink plenty of water until 1 hour prior to the test. ?Do not eat any food 4 hours prior to the test. ?You may take your regular medications prior to the test.  ?Take metoprolol (Lopressor) two hours prior to test. ?HOLD Furosemide/Hydrochlorothiazide morning of the test. ?FEMALES- please wear underwire-free bra if available, avoid dresses & tight clothing ?     ?After the Test: ?Drink plenty of water. ?After receiving IV contrast, you may experience a mild flushed feeling. This is normal. ?On occasion, you may experience a mild rash up to 24 hours after the test. This is not dangerous. If this occurs, you can take Benadryl 25 mg and increase your fluid intake. ?If you experience trouble breathing, this can be serious. If it is severe call 911 IMMEDIATELY. If it is mild, please call our office. ?If you take any of these medications: Glipizide/Metformin, Avandament, Glucavance, please do not take 48 hours after completing test unless otherwise instructed. ? ?We will call to schedule your test 2-4 weeks out  understanding that some insurance companies will need an authorization prior to the service being performed.  ? ?For non-scheduling related questions, please contact the cardiac imaging nurse navigator should you have any questions/concerns: ?Marchia Bond, Cardiac Imaging Nurse Navigator ?Gordy Clement, Cardiac Imaging Nurse Navigator ?[facility] ?Direct Office Dial: [phone]  ? ?For scheduling needs, including cancellations and rescheduling, please call [place], [phone]. ? ? ? ? ? ? ?  ?

## 2022-03-25 NOTE — Progress Notes (Signed)
?Cardiology Office Note:   ? ?Date:  03/25/2022  ? ?ID:  [name], DOB 1956/01/05, MRN [id number] ? ?PCP:  Marrian Salvage, [place] ?  ?[facility] Providers ?Cardiologist:  None    ? ?Referring MD: Marrian Salvage,*  ? ?No chief complaint on file. ?Atypical CP ? ?History of Present Illness:   ? ?[name] [name] is a 66 y.o. female with a hx of Crohns disease s/p transverse colon stricture 2016 referral to cardiology to be evaluated  ? ?She notes localized substernal CP and in her shoulder. This comes and goes. Onset in her 30s, and it resolved after awhile. Noticed it in February. She walks for physical activity. She's had  no progressive SOB or chest pressure.  No prior cardiac w/u. No premature CAD. Former light smoker. TSH normal. Normal DM2. Normal EKG ? ?The 10-year ASCVD risk score (Arnett DK, et al., 2019) is: 3.7% ?  Values used to calculate the score: ?    Age: 41 years ?    Sex: Female ?    Is Non-Hispanic African American: No ?    Diabetic: No ?    Tobacco smoker: No ?    Systolic Blood Pressure: 382 mmHg ?    Is BP treated: No ?    HDL Cholesterol: 47 mg/dL ?    Total Cholesterol: 209 mg/dL ? ? ?Past Medical History:  ?Diagnosis Date  ? Abnormal LFTs severaal years agio, medication related  ? Anemia   ? Breast mass, right 08/24/2013  ? Excised 11/10/13. Path complex sclerosing lesion,, no atypia   ? Contact lens/glasses fitting   ? wears contacts or glasses  ? Crohn disease ([place])   ? Diverticulosis   ? ? ?Past Surgical History:  ?Procedure Laterality Date  ? ABDOMINAL HYSTERECTOMY  2003  ? lt SO-rt ovarian cysy  ? adnoids removed    ? BREAST EXCISIONAL BIOPSY Right   ? a few years ago   ? BREAST LUMPECTOMY WITH NEEDLE LOCALIZATION Right 11/10/2013  ? Procedure: RIGHT BREAST MASS NEEDLE LOCALIZATION;  Surgeon: [name], MD;  Location: [place];  Service: General;  Laterality: Right;  ? BREAST SURGERY    ? LAPAROSCOPIC PARTIAL COLECTOMY N/A 02/08/2015  ? Procedure:  LAPAROSCOPIC PARTIAL COLECTOMY;  Surgeon: Excell Seltzer, MD;  [facility];  Service: General;  Laterality: N/A;  ? ? ?Current Medications: ?No outpatient medications have been marked as taking for the 03/25/22 encounter (Appointment) with Janina Mayo, MD.  ?  ? ?Allergies:   Patient has no known allergies.  ? ?Social History  ? ?Socioeconomic History  ? Marital status: Married  ?  Spouse name: Not on file  ? Number of children: 0  ? Years of education: Not on file  ? Highest education level: Not on file  ?Occupational History  ? Occupation: over the road truck driver  ?Tobacco Use  ? Smoking status: Former  ?  Packs/day: 2.00  ?  Years: 10.00  ?  Pack years: 20.00  ?  Types: Cigarettes  ?  Quit date: 08/24/1988  ?  Years since quitting: 33.6  ? Smokeless tobacco: Never  ?Vaping Use  ? Vaping Use: Never used  ?Substance and Sexual Activity  ? Alcohol use: Yes  ?  Alcohol/week: 2.0 - 3.0 standard drinks  ?  Types: 2 - 3 Standard drinks or equivalent per week  ?  Comment: social  ? Drug use: No  ? Sexual activity: Not on file  ?  Other Topics Concern  ? Not on file  ?Social History Narrative  ? Not on file  ? ?Social Determinants of Health  ? ?Financial Resource Strain: Not on file  ?Food Insecurity: Not on file  ?Transportation Needs: Not on file  ?Physical Activity: Not on file  ?Stress: Not on file  ?Social Connections: Not on file  ?  ? ?Family History: ?The patient's family history includes Breast cancer in her mother and paternal aunt; Cancer in her father; Diabetes in her brother, maternal grandfather, and mother; Heart disease in her maternal grandfather and paternal grandfather; Lung cancer in her father; Stomach cancer in an other family member. There is no history of Colon cancer, Esophageal cancer, Rectal cancer, or Pancreatic cancer. ? ?ROS:   ?Please see the history of present illness.    ? All other systems reviewed and are negative. ? ?EKGs/Labs/Other Studies Reviewed:   ? ?The following studies  were reviewed today: ? ? ?EKG:  EKG is  ordered today.  The ekg ordered today demonstrates  ? ?EKG 5/1 NSR ? ?Recent Labs: ?12/06/2021: ALT 27; BUN 16; Creatinine, Ser 1.04; Hemoglobin 13.1; Platelets 287.0; Potassium 4.4; Sodium 138  ?Recent Lipid Panel ?   ?Component Value Date/Time  ? CHOL 209 (H) 08/04/2020 1025  ? TRIG 143 08/04/2020 1025  ? HDL 47 (L) 08/04/2020 1025  ? CHOLHDL 4.4 08/04/2020 1025  ? VLDL 25.4 01/21/2020 1030  ? LDLCALC 135 (H) 08/04/2020 1025  ? ? ? ?Risk Assessment/Calculations:   ?  ? ?    ? ?Physical Exam:   ? ?VS:   ? ?Vitals:  ? 03/25/22 1020  ?BP: 124/66  ?Pulse: 79  ?SpO2: 98%  ? ? ? ?Wt Readings from Last 3 Encounters:  ?03/30/21 197 lb (89.4 kg)  ?08/04/20 196 lb 12.8 oz (89.3 kg)  ?02/03/20 200 lb (90.7 kg)  ?  ? ?GEN:  Well nourished, well developed in no acute distress ?HEENT: Normal ?NECK: No JVD; No carotid bruits ?LYMPHATICS: No lymphadenopathy ?CARDIAC: RRR, no murmurs, rubs, gallops ?RESPIRATORY:  Clear to auscultation without rales, wheezing or rhonchi  ?ABDOMEN: Soft, non-tender, non-distended ?MUSCULOSKELETAL:  No edema; No deformity  ?SKIN: Warm and dry ?NEUROLOGIC:  Alert and oriented x 3 ?PSYCHIATRIC:  Normal affect  ? ?ASSESSMENT:   ? ?Atypical chest pain:  Low risk ASCVD. Symptoms are not typical of angina. She was concerned and wanted further testing; I discussed her low risk and atypical presentation. Will get CTA. Recommend to continue with lifestyle modification and CVD risk mitigation (yearly A1c, lipid monitoring).  ? ?PLAN:   ? ?In order of problems listed above: ? ?Coronary CTA  ?Follow up as needed (if study abnormal will plan for invasive angiography and follow-up) ? ?   ? ?   ? ?Medication Adjustments/Labs and Tests Ordered: ?Current medicines are reviewed at length with the patient today.  Concerns regarding medicines are outlined above.  ?No orders of the defined types were placed in this encounter. ? ?No orders of the defined types were placed in this  encounter. ? ? ?There are no Patient Instructions on file for this visit.  ? ?Signed, ?Janina Mayo, MD  ?03/25/2022 8:22 AM    ?[place] ?

## 2022-04-02 ENCOUNTER — Ambulatory Visit (INDEPENDENT_AMBULATORY_CARE_PROVIDER_SITE_OTHER): Payer: BC Managed Care – PPO | Admitting: Family

## 2022-04-02 VITALS — BP 122/74 | HR 67 | Temp 97.9°F | Resp 18 | Ht 62.0 in | Wt 204.4 lb

## 2022-04-02 DIAGNOSIS — Z23 Encounter for immunization: Secondary | ICD-10-CM | POA: Diagnosis not present

## 2022-04-02 DIAGNOSIS — Z13220 Encounter for screening for lipoid disorders: Secondary | ICD-10-CM | POA: Diagnosis not present

## 2022-04-02 DIAGNOSIS — R040 Epistaxis: Secondary | ICD-10-CM

## 2022-04-02 LAB — CBC WITH DIFFERENTIAL/PLATELET
Basophils Absolute: 0 10*3/uL (ref 0.0–0.1)
Basophils Relative: 0.2 % (ref 0.0–3.0)
Eosinophils Absolute: 0.2 10*3/uL (ref 0.0–0.7)
Eosinophils Relative: 2.8 % (ref 0.0–5.0)
HCT: 38.1 % (ref 36.0–46.0)
Hemoglobin: 12.8 g/dL (ref 12.0–15.0)
Lymphocytes Relative: 44.5 % (ref 12.0–46.0)
Lymphs Abs: 2.8 10*3/uL (ref 0.7–4.0)
MCHC: 33.6 g/dL (ref 30.0–36.0)
MCV: 85.8 fl (ref 78.0–100.0)
Monocytes Absolute: 0.4 10*3/uL (ref 0.1–1.0)
Monocytes Relative: 6.4 % (ref 3.0–12.0)
Neutro Abs: 2.9 10*3/uL (ref 1.4–7.7)
Neutrophils Relative %: 46.1 % (ref 43.0–77.0)
Platelets: 254 10*3/uL (ref 150.0–400.0)
RBC: 4.44 Mil/uL (ref 3.87–5.11)
RDW: 13.2 % (ref 11.5–15.5)
WBC: 6.4 10*3/uL (ref 4.0–10.5)

## 2022-04-02 LAB — LIPID PANEL
Cholesterol: 212 mg/dL — ABNORMAL HIGH (ref 0–200)
HDL: 47.9 mg/dL (ref >39.00)
LDL Cholesterol: 127 mg/dL — ABNORMAL HIGH (ref 0–99)
NonHDL: 163.89
Total CHOL/HDL Ratio: 4
Triglycerides: 183 mg/dL — ABNORMAL HIGH (ref 0.0–149.0)
VLDL: 36.6 mg/dL (ref 0.0–40.0)

## 2022-04-02 NOTE — Patient Instructions (Signed)
Nosebleed, Adult ?A nosebleed is when blood comes out of the nose. Nosebleeds are common and can be caused by many things. They are usually not a sign of a serious medical problem. ?Follow these instructions at home: ?When you have a nosebleed: ? ?Sit down. ?Tilt your head forward a little. ?Follow these steps: ?Pinch your nose with a clean towel or tissue. ?Keep pinching your nose for 5 minutes. Do not let go. ?After 5 minutes, let go of your nose. ?Keep doing these steps until the bleeding stops. ?Do not put tissues or other things in your nose to stop the bleeding. ?Avoid lying down or putting your head back. ?Use a nose spray decongestant as told by your doctor. ?After a nosebleed: ?Try not to blow your nose or sniffle for several hours. ?Try not to strain, lift, or bend at the waist for several days. ?Aspirin and medicines that thin your blood make bleeding more likely. If you take these medicines: ?Ask your doctor if you should stop taking them or if you should change how much you take. ?Do not stop taking the medicine unless your doctor tells you to. ?If your nosebleed was caused by dryness, use over-the-counter saline nasal spray or gel and a humidifier as told by your doctor. This will keep the inside of your nose moist and allow it to heal. If you need to use nasal spray or gel: ?Choose one that is water-soluble. ?Use only as much as you need and use it only as often as needed. ?Do not lie down right away after you use it. ?If you get nosebleeds often, talk with your doctor about treatments. These may include: ?Nasal cautery. A chemical swab or electrical device is used to lightly burn tiny blood vessels inside the nose. This helps stop or prevent nosebleeds. ?Nasal packing. A gauze or other material is placed in the nose to keep constant pressure on the bleeding area. ?Contact a doctor if: ?You have a fever. ?You get nosebleeds often. ?You get nosebleeds more often than usual. ?You bruise very  easily. ?You have something stuck in your nose. ?You are bleeding in your mouth. ?You vomit or cough up brown material. ?You get a nosebleed after you start a new medicine. ?Get help right away if: ?You have a nosebleed after you fall or hurt your head. ?Your nosebleed does not go away after 20 minutes. ?You feel dizzy or weak. ?You have unusual bleeding from other parts of your body. ?You have unusual bruising on other parts of your body. ?You get sweaty. ?You vomit blood. ?Summary ?Nosebleeds are common. They are usually not a sign of a serious medical problem. ?When you have a nosebleed, sit down and tilt your head a little forward. Pinch your nose with a clean tissue for 5 minutes. ?Use saline spray or saline gel and a humidifier as told by your doctor. ?Get help right away if your nosebleed does not go away after 20 minutes. ?This information is not intended to replace advice given to you by your health care provider. Make sure you discuss any questions you have with your health care provider. ?Document Revised: 11/20/2021 Document Reviewed: 11/20/2021 ?Elsevier Patient Education ? [place]. ? ?

## 2022-04-02 NOTE — Progress Notes (Signed)
?[name] [name] is a 66 y.o. female with the following history as recorded in EpicCare:  ?Patient Active Problem List  ? Diagnosis Date Noted  ? 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 Morbid obesity ([place]) 02/03/2020  ? Sleep apnea 02/03/2020  ? Loud snoring 02/03/2020  ? Crohn's disease without complication ([place]) 40/98/1191  ? Crohn's colitis ([place]) 02/08/2015  ? DIARRHEA 01/03/2011  ? ABNORMAL TRANSAMINASE, (LFT'S) 06/07/2009  ? [place] INTESTINE 06/06/2009  ? CROHN'S DISEASE 03/08/2009  ?  ?Current Outpatient Medications  ?Medication Sig Dispense Refill  ? Adalimumab (HUMIRA PEN) 40 MG/0.4ML PNKT Inject 40 mg into the skin every 14 (fourteen) days. 2 each 11  ? calcium-vitamin D 250-100 MG-UNIT per tablet Take 1 tablet by mouth 2 (two) times daily.    ? carboxymethylcellulose (REFRESH PLUS) 0.5 % SOLN Place 1 drop into both eyes 3 (three) times daily as needed (Dry eyes).    ? Difluprednate 0.05 % EMUL Place 1 drop into the right eye 3 (three) times daily.    ? gatifloxacin (ZYMAXID) 0.5 % SOLN Place 1 drop into the right eye 3 (three) times daily.    ? HUMIRA PEN 40 MG/0.8ML PNKT INJECT 1 PEN UNDER THE SKIN (SUBCUTANEOUS INJECTION) EVERY 14 DAYS 2 each 1  ? ketorolac (ACULAR) 0.5 % ophthalmic solution Place 1 drop into the right eye 2 (two) times daily.    ? Multiple Vitamin (MULTIVITAMIN) tablet Take 1 tablet by mouth daily.    ? vitamin B-12 (CYANOCOBALAMIN) 100 MCG tablet Take 50 mcg by mouth daily.    ? metoprolol tartrate (LOPRESSOR) 100 MG tablet Take 1 tablet (100 mg total) by mouth once for 1 dose. PLEASE TAKE METOPROLOL 2  HOURS PRIOR TO CTA SCAN. 1 tablet 0  ? ?No current facility-administered medications for this visit.  ?  ?Allergies: Patient has no known allergies.  ?Past Medical History:  ?Diagnosis Date  ? Abnormal LFTs severaal years agio, medication related  ? Anemia   ? Breast mass, right 08/24/2013  ? Excised 11/10/13. Path complex sclerosing lesion,, no atypia   ? Contact lens/glasses  fitting   ? wears contacts or glasses  ? Crohn disease ([place])   ? Diverticulosis   ?  ?Past Surgical History:  ?Procedure Laterality Date  ? ABDOMINAL HYSTERECTOMY  2003  ? lt SO-rt ovarian cysy  ? adnoids removed    ? BREAST EXCISIONAL BIOPSY Right   ? a few years ago   ? BREAST LUMPECTOMY WITH NEEDLE LOCALIZATION Right 11/10/2013  ? Procedure: RIGHT BREAST MASS NEEDLE LOCALIZATION;  Surgeon: [name], MD;  Location: [place];  Service: General;  Laterality: Right;  ? BREAST SURGERY    ? LAPAROSCOPIC PARTIAL COLECTOMY N/A 02/08/2015  ? Procedure: LAPAROSCOPIC PARTIAL COLECTOMY;  Surgeon: Excell Seltzer, MD;  [facility];  Service: General;  Laterality: N/A;  ?  ?Family History  ?Problem Relation Age of Onset  ? Diabetes Mother   ? Breast cancer Mother   ? Lung cancer Father   ? Cancer Father   ? Diabetes Brother   ? Breast cancer Paternal Aunt   ? Diabetes Maternal Grandfather   ? Heart disease Maternal Grandfather   ? Heart disease Paternal Grandfather   ? Stomach cancer Other   ? Colon cancer Neg Hx   ? Esophageal cancer Neg Hx   ? Rectal cancer Neg Hx   ? Pancreatic cancer Neg Hx   ?  ?Social History  ? ?Tobacco Use  ?  Smoking status: Former  ?  Packs/day: 2.00  ?  Years: 10.00  ?  Pack years: 20.00  ?  Types: Cigarettes  ?  Quit date: 08/24/1988  ?  Years since quitting: 33.6  ? Smokeless tobacco: Never  ?Substance Use Topics  ? Alcohol use: Yes  ?  Alcohol/week: 2.0 - 3.0 standard drinks  ?  Types: 2 - 3 Standard drinks or equivalent per week  ?  Comment: social  ?  ?Subjective:  ?Follow up on yearly needs; had called last week with concerns for 2 + year of chest pain; has already seen cardiologist and treatment plan in place;  ?Continuing with GI for management of Crohn's Disease; ?GYN- [facility]- overdue for OV; ?Has had 2 nosebleeds in the past week- able to stop with no complications;  ? ? ? ?Objective:  ?Vitals:  ? 04/02/22 0844  ?BP: 122/74  ?Pulse: 67  ?Resp:  18  ?Temp: 97.9 ?F (36.6 ?C)  ?TempSrc: Oral  ?SpO2: 96%  ?Weight: 204 lb 6.4 oz (92.7 kg)  ?Height: 5' 2"  (1.575 m)  ?  ?General: Well developed, well nourished, in no acute distress  ?Skin : Warm and dry.  ?Head: Normocephalic and atraumatic  ?Eyes: Sclera and conjunctiva clear; pupils round and reactive to light; extraocular movements intact  ?Ears: External normal; canals clear; tympanic membranes normal  ?Oropharynx: Pink, supple. No suspicious lesions  ?Neck: Supple without thyromegaly, adenopathy  ?Lungs: Respirations unlabored; clear to auscultation bilaterally without wheeze, rales, rhonchi  ?CVS exam: normal rate and regular rhythm.  ?Neurologic: Alert and oriented; speech intact; face symmetrical; moves all extremities well; CNII-XII intact without focal deficit  ? ?Assessment:  ?1. Nosebleed   ?2. Lipid screening   ?3. Need for tetanus booster   ?  ?Plan:  ?Reassurance; check CBC; recommend saline nasal spray;  ?Check lipid panel;  ?Tdap updated;  ? ?She will schedule follow up with her GYN;  ? ?No follow-ups on file.  ?Orders Placed This Encounter  ?Procedures  ? Tdap vaccine greater than or equal to 7yo IM  ? CBC with Differential/Platelet  ? Lipid panel  ?  ?Requested Prescriptions  ? ? No prescriptions requested or ordered in this encounter  ?  ? ? ?

## 2022-04-09 ENCOUNTER — Telehealth (HOSPITAL_COMMUNITY): Payer: Self-pay | Admitting: Emergency Medicine

## 2022-04-09 NOTE — Telephone Encounter (Signed)
"  Call cannot be completed at this time , please hang up and try your call again later" ? ?Marchia Bond RN Navigator Cardiac Imaging ?[facility] ?[phone] Office  ?[phone] Cell ? ?

## 2022-04-10 ENCOUNTER — Ambulatory Visit (HOSPITAL_COMMUNITY)
Admission: RE | Admit: 2022-04-10 | Discharge: 2022-04-10 | Disposition: A | Discharge status: Home / Self Care | Payer: BC Managed Care – PPO | Source: Ambulatory Visit | Attending: Internal Medicine | Admitting: Internal Medicine

## 2022-04-10 DIAGNOSIS — R072 Precordial pain: Secondary | ICD-10-CM | POA: Insufficient documentation

## 2022-04-10 MED ORDER — NITROGLYCERIN 0.4 MG SL SUBL
SUBLINGUAL_TABLET | SUBLINGUAL | Status: AC
  Filled 2022-04-10: qty 2

## 2022-04-10 MED ORDER — IOHEXOL 350 MG/ML SOLN
125.0000 mL | Freq: Once | INTRAVENOUS | Status: AC | PRN
  Administered 2022-04-10: 125 mL via INTRAVENOUS

## 2022-04-10 MED ORDER — NITROGLYCERIN 0.4 MG SL SUBL
0.8000 mg | SUBLINGUAL_TABLET | Freq: Once | SUBLINGUAL | Status: AC
Start: 2022-04-10 — End: 2022-04-10
  Administered 2022-04-10: 0.8 mg via SUBLINGUAL

## 2022-06-05 DIAGNOSIS — H2513 Age-related nuclear cataract, bilateral: Secondary | ICD-10-CM | POA: Diagnosis not present

## 2022-06-05 DIAGNOSIS — H2511 Age-related nuclear cataract, right eye: Secondary | ICD-10-CM | POA: Diagnosis not present

## 2022-06-06 DIAGNOSIS — H2512 Age-related nuclear cataract, left eye: Secondary | ICD-10-CM | POA: Diagnosis not present

## 2022-06-06 DIAGNOSIS — H25012 Cortical age-related cataract, left eye: Secondary | ICD-10-CM | POA: Diagnosis not present

## 2022-06-06 DIAGNOSIS — H25042 Posterior subcapsular polar age-related cataract, left eye: Secondary | ICD-10-CM | POA: Diagnosis not present

## 2022-06-12 DIAGNOSIS — Z96651 Presence of right artificial knee joint: Secondary | ICD-10-CM | POA: Diagnosis not present

## 2022-06-19 DIAGNOSIS — H2512 Age-related nuclear cataract, left eye: Secondary | ICD-10-CM | POA: Diagnosis not present

## 2022-08-28 ENCOUNTER — Other Ambulatory Visit: Payer: Self-pay | Admitting: Obstetrics and Gynecology

## 2022-08-28 DIAGNOSIS — N63 Unspecified lump in unspecified breast: Secondary | ICD-10-CM

## 2022-09-19 ENCOUNTER — Ambulatory Visit
Admission: RE | Admit: 2022-09-19 | Discharge: 2022-09-19 | Disposition: A | Discharge status: Home / Self Care | Payer: BC Managed Care – PPO | Source: Ambulatory Visit | Attending: Obstetrics and Gynecology | Admitting: Obstetrics and Gynecology

## 2022-09-19 DIAGNOSIS — N63 Unspecified lump in unspecified breast: Secondary | ICD-10-CM

## 2022-12-20 ENCOUNTER — Telehealth: Payer: Self-pay | Admitting: Physician Assistant

## 2022-12-20 ENCOUNTER — Other Ambulatory Visit: Payer: Self-pay

## 2022-12-20 MED ORDER — HUMIRA (2 PEN) 40 MG/0.4ML ~~LOC~~ AJKT: 40.0000 mg | AUTO-INJECTOR | SUBCUTANEOUS | 1 refills | Status: DC

## 2022-12-20 NOTE — Telephone Encounter (Signed)
Patient returned call; schedule OV for 01/20/2023

## 2022-12-20 NOTE — Telephone Encounter (Signed)
Left message for pt to call back. Pt needs an OV to continue to get refills.  Refill until scheduled OV sent to pharmacy.

## 2022-12-20 NOTE — Telephone Encounter (Signed)
Patient is calling to have her refill for Humira sent to Pharmacy. Please advise

## 2023-01-20 ENCOUNTER — Ambulatory Visit (INDEPENDENT_AMBULATORY_CARE_PROVIDER_SITE_OTHER): Payer: Medicare Other | Admitting: Internal Medicine

## 2023-01-20 ENCOUNTER — Telehealth: Payer: Self-pay

## 2023-01-20 ENCOUNTER — Encounter: Payer: Self-pay | Admitting: Internal Medicine

## 2023-01-20 VITALS — BP 120/68 | HR 74 | Ht 63.0 in | Wt 189.0 lb

## 2023-01-20 DIAGNOSIS — Z79899 Other long term (current) drug therapy: Secondary | ICD-10-CM | POA: Diagnosis not present

## 2023-01-20 DIAGNOSIS — K50119 Crohn's disease of large intestine with unspecified complications: Secondary | ICD-10-CM | POA: Diagnosis not present

## 2023-01-20 MED ORDER — HUMIRA (2 PEN) 40 MG/0.4ML ~~LOC~~ AJKT: 40.0000 mg | AUTO-INJECTOR | SUBCUTANEOUS | 1 refills | Status: DC

## 2023-01-20 MED ORDER — PLENVU 140 G PO SOLR: 1.0000 | Freq: Once | ORAL | 0 refills | Status: AC

## 2023-01-20 NOTE — Telephone Encounter (Signed)
[place] please see note below from Dr. Henrene Pastor. Need to know how much Humira biosimilar would be for pt. She has medicare now and cannot afford Humira. Please let me know. She is on '40mg'$  every 14days. Please let me know.

## 2023-01-20 NOTE — Patient Instructions (Signed)
_______________________________________________________  If your blood pressure at your visit was 140/90 or greater, please contact your primary care physician to follow up on this.  _______________________________________________________  If you are age 65 or older, your body mass index should be between 23-30. Your Body mass index is 33.48 kg/m. If this is out of the aforementioned range listed, please consider follow up with your Primary Care Provider.  If you are age 25 or younger, your body mass index should be between 19-25. Your Body mass index is 33.48 kg/m. If this is out of the aformentioned range listed, please consider follow up with your Primary Care Provider.   ________________________________________________________  The [facility] [place] [facility] providers would like to encourage you to use [facility] to communicate with providers for non-urgent requests or questions.  Due to long hold times on the telephone, sending your provider a message by [facility] may be a faster and more efficient way to get a response.  Please allow 48 business hours for a response.  Please remember that this is for non-urgent requests.  _______________________________________________________  Dennis Bast have been scheduled for a colonoscopy. Please follow written instructions given to you at your visit today.  Please pick up your prep supplies at the pharmacy within the next 1-3 days. If you use inhalers (even only as needed), please bring them with you on the day of your procedure.

## 2023-01-20 NOTE — Progress Notes (Signed)
HISTORY OF PRESENT ILLNESS:  [name] [name] is a 67 y.o. female, retired Programmer, systems, with a history of Crohn's colitis status post segmental resection for transverse colon stricture in 2016.  She has been on chronic Humira therapy over the past 7 years and felt to be in remission.  She was last seen in this office December 06, 2021 by the GI physician assistant regarding left-sided abdominal discomfort of 2 months duration.  No other significant associated symptoms.  See dictation for details.  Blood work from that day was fairly unremarkable.  Discussions regarding CT scan colonoscopy were had, but the patient wanted to wait.  She has not been seen since but tells me that her discomfort resolved.  She has continued on Humira.  She is feeling well.  Has 1 soft bowel movement per day.  Occasional left-sided discomfort.  No bleeding.  Stable weight.  Colonoscopy 2019.  She is due for surveillance this year.  She has concerns over Humira.  Now on Medicare.  States will cost her $500 per month, which she cannot afford.  Wonders about alternatives.  She has not investigated with her insurance other viable options.  REVIEW OF SYSTEMS:  All non-GI ROS negative except for pruritus, psoriasis  Past Medical History:  Diagnosis Date   Abnormal LFTs severaal years agio, medication related   Anemia    Breast mass, right 08/24/2013   Excised 11/10/13. Path complex sclerosing lesion,, no atypia    Contact lens/glasses fitting    wears contacts or glasses   Crohn disease ([place])    Diverticulosis     Past Surgical History:  Procedure Laterality Date   ABDOMINAL HYSTERECTOMY  2003   lt SO-rt ovarian cysy   adnoids removed     BREAST EXCISIONAL BIOPSY Right    a few years ago    BREAST LUMPECTOMY WITH NEEDLE LOCALIZATION Right 11/10/2013   Procedure: RIGHT BREAST MASS NEEDLE LOCALIZATION;  Surgeon: [name], MD;  Location: [facility];  Service: General;  Laterality:  Right;   BREAST SURGERY     LAPAROSCOPIC PARTIAL COLECTOMY N/A 02/08/2015   Procedure: LAPAROSCOPIC PARTIAL COLECTOMY;  Surgeon: Excell Seltzer, MD;  Location: [facility];  Service: General;  Laterality: N/A;    Social History [name]  reports that she quit smoking about 34 years ago. Her smoking use included cigarettes. She has a 20.00 pack-year smoking history. She has never used smokeless tobacco. She reports current alcohol use of about 2.0 - 3.0 standard drinks of alcohol per week. She reports that she does not use drugs.  family history includes Breast cancer in her mother and paternal aunt; Cancer in her father; Diabetes in her brother, maternal grandfather, and mother; Heart disease in her maternal grandfather and paternal grandfather; Lung cancer in her father; Stomach cancer in an other family member.  No Known Allergies     PHYSICAL EXAMINATION: Vital signs: BP 120/68   Pulse 74   Ht '5\' 3"'$  (1.6 m)   Wt 189 lb (85.7 kg)   SpO2 98%   BMI 33.48 kg/m   Constitutional: generally well-appearing, no acute distress Psychiatric: alert and oriented x3, cooperative Eyes: extraocular movements intact, anicteric, conjunctiva pink Mouth: oral pharynx moist, no lesions Neck: supple no lymphadenopathy Cardiovascular: heart regular rate and rhythm, no murmur Lungs: clear to auscultation bilaterally Abdomen: soft, nontender, nondistended, no obvious ascites, no peritoneal signs, normal bowel sounds, no organomegaly Rectal: Deferred to colonoscopy Extremities: no clubbing, cyanosis, or lower  extremity edema bilaterally Skin: no lesions on visible extremities Neuro: No focal deficits.  Cranial nerves intact  ASSESSMENT:  1.  Crohn's colitis.  In clinical remission on Humira.  Last colonoscopy 2019 2.  History of symptomatic transverse benign noninflammatory colon stricture after successful biologic therapy, status post segmental resection 2016 3.  Medication affordability  issues   PLAN:  1.  Humira refilled 2.  Will ask my nurse, Vaughan Basta, to work with the patient regarding possible options, such as biosimilar's or Humira 3.  Schedule surveillance colonoscopy.The nature of the procedure, as well as the risks, benefits, and alternatives were carefully and thoroughly reviewed with the patient. Ample time for discussion and questions allowed. The patient understood, was satisfied, and agreed to proceed. 4.  Office follow-up at least annually. A total time of 35 minutes was spent preparing to see the patient, reviewing data, obtaining comprehensive history, performing medically appropriate physical examination, counseling and educating patient regarding above listed issues, ordering medication, ordering endoscopic procedure (colonoscopy), and documenting clinical information in the health record

## 2023-01-20 NOTE — Telephone Encounter (Signed)
-----   Message from Irene Shipper, MD sent at 01/20/2023  1:19 PM EST ----- Regarding: Change from Humira to more affordable alternative for Crohn's disease [name] [name], This patient has done nicely on Humira for her Crohn's. She just enrolled in Medicare, tells me she cannot afford Humira. See if there are Humira biosimilar's that are more affordable. Other alternatives?,  that she can afford. I told her that you would call her, and work on this issue.  Let me know what transpires.  Thanks. Dr. Henrene Pastor

## 2023-01-20 NOTE — Addendum Note (Signed)
Addended by: Audrea Muscat on: 01/20/2023 02:10 PM   Modules accepted: Orders

## 2023-01-22 ENCOUNTER — Other Ambulatory Visit: Payer: Self-pay

## 2023-01-22 MED ORDER — ADALIMUMAB-AACF 40 MG/0.8ML ~~LOC~~ AJKT: 40.0000 mg | AUTO-INJECTOR | SUBCUTANEOUS | 11 refills | Status: AC

## 2023-01-22 MED ORDER — ADALIMUMAB-AACF 40 MG/0.8ML ~~LOC~~ AJKT: 40.0000 mg | AUTO-INJECTOR | SUBCUTANEOUS | 11 refills | Status: DC

## 2023-01-24 ENCOUNTER — Telehealth: Payer: Self-pay | Admitting: Internal Medicine

## 2023-01-24 NOTE — Telephone Encounter (Signed)
Discussed with pt that Dr. Henrene Pastor has [place] checking on humira biosimilar. Let her know PA team is working on this . She is aware.

## 2023-01-24 NOTE — Telephone Encounter (Signed)
Patient is calling state she would like to make sure her Humira was renewed at the right pharmacy and that she never received her medication list she was supposed to get, wishing to speak with the nurse. Please advise

## 2023-01-29 ENCOUNTER — Encounter: Payer: Self-pay | Admitting: Internal Medicine

## 2023-01-29 ENCOUNTER — Ambulatory Visit: Payer: Medicare Other | Admitting: Internal Medicine

## 2023-01-29 VITALS — BP 106/62 | HR 55 | Temp 97.1°F | Resp 11 | Ht 63.0 in | Wt 189.0 lb

## 2023-01-29 DIAGNOSIS — K5010 Crohn's disease of large intestine without complications: Secondary | ICD-10-CM

## 2023-01-29 DIAGNOSIS — K50119 Crohn's disease of large intestine with unspecified complications: Secondary | ICD-10-CM

## 2023-01-29 MED ORDER — SODIUM CHLORIDE 0.9 % IV SOLN: 500.0000 mL | Freq: Once | INTRAVENOUS | Status: DC

## 2023-01-29 NOTE — Progress Notes (Signed)
Pt's states no medical or surgical changes since previsit or office visit. 

## 2023-01-29 NOTE — Patient Instructions (Signed)
Repeat colonoscopy in 5 years.   YOU HAD AN ENDOSCOPIC PROCEDURE TODAY AT [facility]:   Refer to the procedure report that was given to you for any specific questions about what was found during the examination.  If the procedure report does not answer your questions, please call your gastroenterologist to clarify.  If you requested that your care partner not be given the details of your procedure findings, then the procedure report has been included in a sealed envelope for you to review at your convenience later.  YOU SHOULD EXPECT: Some feelings of bloating in the abdomen. Passage of more gas than usual.  Walking can help get rid of the air that was put into your GI tract during the procedure and reduce the bloating. If you had a lower endoscopy (such as a colonoscopy or flexible sigmoidoscopy) you may notice spotting of blood in your stool or on the toilet paper. If you underwent a bowel prep for your procedure, you may not have a normal bowel movement for a few days.  Please Note:  You might notice some irritation and congestion in your nose or some drainage.  This is from the oxygen used during your procedure.  There is no need for concern and it should clear up in a day or so.  SYMPTOMS TO REPORT IMMEDIATELY:  Following lower endoscopy (colonoscopy or flexible sigmoidoscopy):  Excessive amounts of blood in the stool  Significant tenderness or worsening of abdominal pains  Swelling of the abdomen that is new, acute  Fever of 100F or higher  For urgent or emergent issues, a gastroenterologist can be reached at any hour by calling [phone]. Do not use MyChart messaging for urgent concerns.    DIET:  We do recommend a small meal at first, but then you may proceed to your regular diet.  Drink plenty of fluids but you should avoid alcoholic beverages for 24 hours.  ACTIVITY:  You should plan to take it easy for the rest of today and you should NOT DRIVE or use heavy  machinery until tomorrow (because of the sedation medicines used during the test).    FOLLOW UP: Our staff will call the number listed on your records the next business day following your procedure.  We will call around 7:15- 8:00 am to check on you and address any questions or concerns that you may have regarding the information given to you following your procedure. If we do not reach you, we will leave a message.     If any biopsies were taken you will be contacted by phone or by letter within the next 1-3 weeks.  Please call us at [phone] if you have not heard about the biopsies in 3 weeks.    SIGNATURES/CONFIDENTIALITY: You and/or your care partner have signed paperwork which will be entered into your electronic medical record.  These signatures attest to the fact that that the information above on your After Visit Summary has been reviewed and is understood.  Full responsibility of the confidentiality of this discharge information lies with you and/or your care-partner.

## 2023-01-29 NOTE — Progress Notes (Signed)
Report to PACU, RN, vss, BBS= Clear.  

## 2023-01-29 NOTE — Op Note (Signed)
[facility] Patient Name: [name] [name] Procedure Date: 01/29/2023 8:09 AM MRN: [id number] Endoscopist: Docia Chuck. Henrene Pastor , MD, [id number] Age: 67 Referring MD:  Date of Birth: June 27, 1956 Gender: Female Account #: [id number] Procedure:                Colonoscopy Indications:              High risk colon cancer surveillance: Crohn's                            colitis of 8 (or more) years duration with                            one-third (or more) of the colon involved. Prior                            segmental which section for transverse colon                            stricture. Last colonoscopy 2019 without active                            disease. In clinical remission on Humira Medicines:                Monitored Anesthesia Care Procedure:                Pre-Anesthesia Assessment:                           - Prior to the procedure, a History and Physical                            was performed, and patient medications and                            allergies were reviewed. The patient's tolerance of                            previous anesthesia was also reviewed. The risks                            and benefits of the procedure and the sedation                            options and risks were discussed with the patient.                            All questions were answered, and informed consent                            was obtained. Prior Anticoagulants: The patient has                            taken no anticoagulant or antiplatelet agents.  After reviewing the risks and benefits, the patient                            was deemed in satisfactory condition to undergo the                            procedure.                           After obtaining informed consent, the colonoscope                            was passed under direct vision. Throughout the                            procedure, the patient's blood pressure, pulse, and                             oxygen saturations were monitored continuously. The                            Olympus [id number]-[id number]) Colonoscope was                            introduced through the anus and advanced to the the                            cecum, identified by appendiceal orifice and                            ileocecal valve. The terminal ileum, ileocecal                            valve, appendiceal orifice, and rectum were                            photographed. The quality of the bowel preparation                            was excellent. The colonoscopy was performed                            without difficulty. The patient tolerated the                            procedure well. The bowel preparation used was                            SUPREP via split dose instruction. Scope In: 8:19:54 AM Scope Out: 8:26:45 AM Scope Withdrawal Time: 0 hours 4 minutes 30 seconds  Total Procedure Duration: 0 hours 6 minutes 51 seconds  Findings:                 The terminal ileum appeared normal.  A few diverticula were found in the sigmoid colon.                           Internal hemorrhoids were found during                            retroflexion. The hemorrhoids were small.                           The exam was otherwise without abnormality on                            direct and retroflexion views. Complications:            No immediate complications. Estimated blood loss:                            None. Estimated Blood Loss:     Estimated blood loss: none. Impression:               - The examined portion of the ileum was normal.                           - Diverticulosis in the sigmoid colon.                           - Internal hemorrhoids.                           - The examination was otherwise normal on direct                            and retroflexion views.                           -Follow-up with my nurse regarding possible                             biosimilar options to Humira given cost concerns                           ?" Routine office follow-up 1 year. Recommendation:           - Repeat colonoscopy in 5 years for surveillance.                           - Patient has a contact number available for                            emergencies. The signs and symptoms of potential                            delayed complications were discussed with the                            patient. Return to normal activities tomorrow.  Written discharge instructions were provided to the                            patient.                           - Resume previous diet.                           - Continue present medications. Docia Chuck. Henrene Pastor, MD 01/29/2023 8:34:25 AM This report has been signed electronically.

## 2023-01-30 ENCOUNTER — Telehealth: Payer: Self-pay

## 2023-01-30 ENCOUNTER — Telehealth: Payer: Self-pay | Admitting: Internal Medicine

## 2023-01-30 NOTE — Telephone Encounter (Signed)
Pt reports that paperwork is supposed to be faxed for abbvie assistance and she needs [place] to fill out the MD portion.Will let pt know if we receive this paperwork.

## 2023-01-30 NOTE — Telephone Encounter (Signed)
  Follow up Call-     01/29/2023    7:12 AM  Call back number  Post procedure Call Back phone  # [phone]  Permission to leave phone message Yes     Patient questions:  Do you have a fever, pain , or abdominal swelling? No. Pain Score  0 *  Have you tolerated food without any problems? Yes.    Have you been able to return to your normal activities? Yes.    Do you have any questions about your discharge instructions: Diet   No. Medications  No. Follow up visit  No.  Do you have questions or concerns about your Care? No.  Actions: * If pain score is 4 or above: No action needed, pain <4.

## 2023-01-30 NOTE — Telephone Encounter (Signed)
Inbound call from patient requesting to speak to nurse Vaughan Basta about Oneita Kras paperwork that she would like to discuss. Please advise.

## 2023-01-30 NOTE — Telephone Encounter (Signed)
Any update on the status of Humira biosimilar?

## 2023-01-31 ENCOUNTER — Other Ambulatory Visit (HOSPITAL_COMMUNITY): Payer: Self-pay

## 2023-01-31 NOTE — Progress Notes (Signed)
She is trying to get assistance from [place] but as stated in both of my previous messages she wants to know after she meets her deductible what the copay would be for the Humira biosimilar.

## 2023-01-31 NOTE — Telephone Encounter (Signed)
Spoke with pt and let her know we did get the fax and will fill out the MD portion and fax it for her.

## 2023-01-31 NOTE — Progress Notes (Signed)
Additionally PA is not needed for this one either. This biosimilar puts patient in coverage gap (donut hole)

## 2023-01-31 NOTE — Progress Notes (Signed)
Pt states she has medicare and well[employer] so she wants to know what her cost would be once deductible met.

## 2023-01-31 NOTE — Telephone Encounter (Signed)
PA is not needed for biosimilar Adalimumab-adaz, however test billing with [facility] does show that pt has an unmet deductible of ~1100 that is causing her copay to be $773.26.

## 2023-01-31 NOTE — Progress Notes (Signed)
So patient will continue on Humira, and will seek assitance with Abbvie, is this correct?

## 2023-01-31 NOTE — Telephone Encounter (Signed)
Patient aware but wants to know what her cost would be once the deductible is met.

## 2023-01-31 NOTE — Progress Notes (Signed)
It does not tell me what her copay will be after deductible is met.

## 2023-03-06 ENCOUNTER — Encounter: Payer: Self-pay | Admitting: Family

## 2023-03-06 ENCOUNTER — Ambulatory Visit (INDEPENDENT_AMBULATORY_CARE_PROVIDER_SITE_OTHER): Payer: Medicare Other | Admitting: Family

## 2023-03-06 VITALS — BP 118/74 | HR 64 | Resp 18 | Ht 63.0 in | Wt 190.0 lb

## 2023-03-06 DIAGNOSIS — H6123 Impacted cerumen, bilateral: Secondary | ICD-10-CM | POA: Diagnosis not present

## 2023-03-06 NOTE — Progress Notes (Signed)
[name] [name] is a 67 y.o. 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 2 each 11   calcium-vitamin D 250-100 MG-UNIT per tablet Take 1 tablet by mouth 2 (two) times daily.     carboxymethylcellulose (REFRESH PLUS) 0.5 % SOLN Place 1 drop into both eyes 3 (three) times daily as needed (Dry eyes).     Multiple Vitamin (MULTIVITAMIN) tablet Take 1 tablet by mouth daily.     vitamin B-12 (CYANOCOBALAMIN) 100 MCG tablet Take 50 mcg by mouth daily.     metoprolol tartrate (LOPRESSOR) 100 MG tablet Take 1 tablet (100 mg total) by mouth once for 1 dose. PLEASE TAKE METOPROLOL 2  HOURS PRIOR TO CTA SCAN. (Patient not taking: Reported on 01/29/2023) 1 tablet 0   No current facility-administered medications for this visit.    Allergies: Patient has no known allergies.  Past Medical History:  Diagnosis Date   Abnormal LFTs severaal years agio, medication related   Anemia    Arthritis    Breast mass, right 08/24/2013   Excised 11/10/13. Path complex sclerosing lesion,, no atypia    Cataract    Contact lens/glasses fitting    wears contacts or glasses   Crohn disease    Diverticulosis     Past Surgical History:  Procedure Laterality Date   ABDOMINAL HYSTERECTOMY  2003   lt SO-rt ovarian cysy   adnoids removed     BREAST EXCISIONAL BIOPSY Right    a few years ago    BREAST LUMPECTOMY WITH NEEDLE LOCALIZATION Right 11/10/2013   Procedure:  RIGHT BREAST MASS NEEDLE LOCALIZATION;  Surgeon: Currie Parishristian J Streck, MD;  Location: [facility];  Service: General;  Laterality: Right;   BREAST SURGERY     LAPAROSCOPIC PARTIAL COLECTOMY N/A 02/08/2015   Procedure: LAPAROSCOPIC PARTIAL COLECTOMY;  Surgeon: Glenna FellowsBenjamin Hoxworth, MD;  Location: [facility];  Service: General;  Laterality: N/A;    Family History  Problem Relation Age of Onset   Diabetes Mother    Breast cancer Mother    Lung cancer Father    Cancer Father    Diabetes Brother    Breast cancer Paternal Aunt    Diabetes Maternal Grandfather    Heart disease Maternal Grandfather    Heart disease Paternal Grandfather    Colon cancer Neg Hx    Esophageal cancer Neg Hx    Rectal cancer Neg Hx    Pancreatic cancer Neg Hx    Stomach cancer Neg Hx     Social History   Tobacco Use   Smoking status: Former    Packs/day: 2.00    Years: 10.00    Additional pack years: 0.00    Total pack years: 20.00    Types: Cigarettes    Quit date: 08/24/1988    Years since quitting: 34.5   Smokeless tobacco: Never  Substance Use Topics   Alcohol use: Yes    Alcohol/week: 2.0 - 3.0 standard drinks of alcohol  Types: 2 - 3 Standard drinks or equivalent per week    Comment: social    Subjective:   Concern for ear fullness x 2-3 weeks; ? If needs to have ear wax removed;   Objective:  Vitals:   03/06/23 1500  BP: 118/74  Pulse: 64  Resp: 18  SpO2: 99%  Weight: 190 lb (86.2 kg)  Height: 5\' 3"  (1.6 m)    General: Well developed, well nourished, in no acute distress  Skin : Warm and dry.  Head: Normocephalic and atraumatic  Eyes: Sclera and conjunctiva clear; pupils round and reactive to light; extraocular movements intact  Ears: External normal; bilateral cerumen impaction noted; after lavage, canals clear; tympanic membranes normal  Oropharynx: Pink, supple. No suspicious lesions  Neck: Supple without thyromegaly, adenopathy  Lungs: Respirations unlabored;   Neurologic: Alert and oriented; speech intact; face symmetrical; moves all extremities well; CNII-XII intact without focal deficit   Assessment:  1. Bilateral impacted cerumen     Plan:  Ear lavage completed with no complications; follow up as needed otherwise.   No follow-ups on file.  No orders of the defined types were placed in this encounter.   Requested Prescriptions    No prescriptions requested or ordered in this encounter

## 2023-10-30 ENCOUNTER — Telehealth: Payer: Self-pay | Admitting: Internal Medicine

## 2023-10-30 NOTE — Telephone Encounter (Signed)
Inbound call from patient requesting to speak with a nurse in regards to recommendations for bio similars. Please advise.

## 2023-10-30 NOTE — Telephone Encounter (Signed)
[name] [name], As the patient probably has realized there are multiple biosimilars, now available, for Humira. They all should be equivalent in terms of efficacy and side effect profile. Having said that, she should check with her insurance company to see if there is a preference.  If so, give [place] that specific name and we can be sure that it is appropriate. Thanks. Dr. Marina Goodell

## 2023-10-30 NOTE — Telephone Encounter (Signed)
Pt calling wanting to know what biosimilar form of Humira Dr. Marina Goodell recommends. She is trying to decide what insurance to go with for next year and would like to see what biosimilar he suggests. Please advise.

## 2023-10-31 NOTE — Telephone Encounter (Signed)
Spoke with pt and she is aware of Dr. Lamar Sprinkles recommendations. Names of Humira biosimilars sent to pt via mychart per her request.

## 2023-11-17 ENCOUNTER — Telehealth: Payer: Self-pay | Admitting: Internal Medicine

## 2023-11-17 NOTE — Telephone Encounter (Signed)
Patient called and stated that she was applying again for a program that covers her Humira medication. Patient stated that the program faxed over paperwork for Dr. Marina Goodell to fill out. Patient wants to make sure we received it and is requesting a call back. Please advise.

## 2023-11-24 NOTE — Telephone Encounter (Signed)
Form filled out and sent to Dr. Marina Goodell to sign.

## 2023-12-01 NOTE — Telephone Encounter (Signed)
 Patient called wanting to check on status of forms

## 2023-12-01 NOTE — Telephone Encounter (Signed)
 Spoke with pt and let her know the form was faxed on 11/28/23. She knows to call us back if there are any issues with [employer] saying they did not receive the form.

## 2024-01-08 ENCOUNTER — Ambulatory Visit: Payer: Medicare Other

## 2024-01-08 VITALS — Ht 63.0 in | Wt 190.0 lb

## 2024-01-08 DIAGNOSIS — Z0000 Encounter for general adult medical examination without abnormal findings: Secondary | ICD-10-CM

## 2024-01-08 NOTE — Progress Notes (Signed)
Subjective:   [name] [name] is a 68 y.o. female who presents for Medicare Annual (Subsequent) preventive examination.  Visit Complete: Virtual I connected with  [name] [name] on 01/08/24 by a audio enabled telemedicine application and verified that I am speaking with the correct person using two identifiers.  Patient Location: Home  Provider Location: Home Office  I discussed the limitations of evaluation and management by telemedicine. The patient expressed understanding and agreed to proceed.  Vital Signs: Because this visit was a virtual/telehealth visit, some criteria may be missing or patient reported. Any vitals not documented were not able to be obtained and vitals that have been documented are patient reported.   Cardiac Risk Factors include: advanced age (>85men, >51 women)     Objective:    Today's Vitals   01/08/24 1012  Weight: 190 lb (86.2 kg)  Height: 5\' 3"  (1.6 m)   Body mass index is 33.66 kg/m.     01/08/2024   10:19 AM 02/08/2015    3:00 PM 02/06/2015    8:19 AM 11/08/2013   10:53 AM  Advanced Directives  Does Patient Have a Medical Advance Directive? Yes No No Patient does not have advance directive;Patient would not like information  Type of Public librarian Power of [place];Living will     Copy of Healthcare Power of Attorney in Chart? No - copy requested       Current Medications (verified) Outpatient Encounter Medications as of 01/08/2024  Medication Sig   Adalimumab-aacf 40 MG/0.8ML AJKT Inject 40 mg into the skin every 14 (fourteen) days.   calcium-vitamin D 250-100 MG-UNIT per tablet Take 1 tablet by mouth 2 (two) times daily.   carboxymethylcellulose (REFRESH PLUS) 0.5 % SOLN Place 1 drop into both eyes 3 (three) times daily as needed (Dry eyes).   metoprolol tartrate (LOPRESSOR) 100 MG tablet Take 1 tablet (100 mg total) by mouth once for 1 dose. PLEASE TAKE METOPROLOL 2  HOURS PRIOR TO CTA SCAN. (Patient not taking: Reported on  01/29/2023)   Multiple Vitamin (MULTIVITAMIN) tablet Take 1 tablet by mouth daily.   vitamin B-12 (CYANOCOBALAMIN) 100 MCG tablet Take 50 mcg by mouth daily.   No facility-administered encounter medications on file as of 01/08/2024.    Allergies (verified) Patient has no known allergies.   History: Past Medical History:  Diagnosis Date   Abnormal LFTs severaal years agio, medication related   Anemia    Arthritis    Breast mass, right 08/24/2013   Excised 11/10/13. Path complex sclerosing lesion,, no atypia    Cataract    Contact lens/glasses fitting    wears contacts or glasses   Crohn disease (HCC)    Diverticulosis    Past Surgical History:  Procedure Laterality Date   ABDOMINAL HYSTERECTOMY  2003   lt SO-rt ovarian cysy   adnoids removed     BREAST EXCISIONAL BIOPSY Right    a few years ago    BREAST LUMPECTOMY WITH NEEDLE LOCALIZATION Right 11/10/2013   Procedure: RIGHT BREAST MASS NEEDLE LOCALIZATION;  Surgeon: Currie Paris, MD;  Location: [facility];  Service: General;  Laterality: Right;   BREAST SURGERY     LAPAROSCOPIC PARTIAL COLECTOMY N/A 02/08/2015   Procedure: LAPAROSCOPIC PARTIAL COLECTOMY;  Surgeon: Glenna Fellows, MD;  Location: [facility];  Service: General;  Laterality: N/A;   Family History  Problem Relation Age of Onset   Diabetes Mother    Breast cancer Mother    Lung cancer Father  Cancer Father    Diabetes Brother    Breast cancer Paternal Aunt    Diabetes Maternal Grandfather    Heart disease Maternal Grandfather    Heart disease Paternal Grandfather    Colon cancer Neg Hx    Esophageal cancer Neg Hx    Rectal cancer Neg Hx    Pancreatic cancer Neg Hx    Stomach cancer Neg Hx    Social History   Socioeconomic History   Marital status: Married    Spouse name: Not on file   Number of children: 0   Years of education: Not on file   Highest education level: Not on file  Occupational History   Occupation: over the  road truck driver  Tobacco Use   Smoking status: Former    Current packs/day: 0.00    Average packs/day: 2.0 packs/day for 10.0 years (20.0 ttl pk-yrs)    Types: Cigarettes    Start date: 08/24/1978    Quit date: 08/24/1988    Years since quitting: 35.3   Smokeless tobacco: Never  Vaping Use   Vaping status: Never Used  Substance and Sexual Activity   Alcohol use: Yes    Alcohol/week: 2.0 - 3.0 standard drinks of alcohol    Types: 2 - 3 Standard drinks or equivalent per week    Comment: social   Drug use: No   Sexual activity: Not on file  Other Topics Concern   Not on file  Social History Narrative   Not on file   Social Drivers of Health   Financial Resource Strain: Low Risk  (01/08/2024)   Overall Financial Resource Strain (CARDIA)    Difficulty of Paying Living Expenses: Not hard at all  Food Insecurity: No Food Insecurity (01/08/2024)   Hunger Vital Sign    Worried About Running Out of Food in the Last Year: Never true    Ran Out of Food in the Last Year: Never true  Transportation Needs: No Transportation Needs (01/08/2024)   PRAPARE - Administrator, Civil Service (Medical): No    Lack of Transportation (Non-Medical): No  Physical Activity: Sufficiently Active (01/08/2024)   Exercise Vital Sign    Days of Exercise per Week: 5 days    Minutes of Exercise per Session: 60 min  Stress: No Stress Concern Present (01/08/2024)   [employer] of Occupational Health - Occupational Stress Questionnaire    Feeling of Stress : Not at all  Social Connections: Moderately Isolated (01/08/2024)   Social Connection and Isolation Panel [NHANES]    Frequency of Communication with Friends and Family: More than three times a week    Frequency of Social Gatherings with Friends and Family: More than three times a week    Attends Religious Services: Never    Database administrator or Organizations: No    Attends Engineer, structural: Never    Marital Status: Married     Tobacco Counseling Counseling given: Not Answered   Clinical Intake:  Pre-visit preparation completed: Yes  Pain : No/denies pain     BMI - recorded: 33.66 Nutritional Status: BMI > 30  Obese Nutritional Risks: None Diabetes: No  How often do you need to have someone help you when you read instructions, pamphlets, or other written materials from your doctor or pharmacy?: 1 - Never  Interpreter Needed?: No  Information entered by :: Theresa Mulligan LPN   Activities of Daily Living    01/08/2024   10:17 AM  In your  present state of health, do you have any difficulty performing the following activities:  Hearing? 0  Vision? 0  Difficulty concentrating or making decisions? 0  Walking or climbing stairs? 0  Dressing or bathing? 0  Doing errands, shopping? 0  Preparing Food and eating ? N  Using the Toilet? N  In the past six months, have you accidently leaked urine? N  Do you have problems with loss of bowel control? N  Managing your Medications? N  Managing your Finances? N  Housekeeping or managing your Housekeeping? N    Patient Care Team: Olive Bass, FNP as PCP - General (Internal Medicine) Maisie Fus, MD as PCP - Cardiology (Cardiology) Candice Camp, MD as Consulting Physician (Obstetrics and Gynecology) Dannielle Huh, MD as Consulting Physician (Orthopedic Surgery)  Indicate any recent Medical Services you may have received from other than [facility] providers in the past year (date may be approximate).     Assessment:   This is a routine wellness examination for Matsuko.  Hearing/Vision screen Hearing Screening - Comments:: Denies hearing difficulties   Vision Screening - Comments:: Wears rx glasses - up to date with routine eye exams with  Dr Caren Hazy   Goals Addressed               This Visit's Progress     Increase physical activity (pt-stated)        Stay Active.       Depression Screen    01/08/2024   10:16 AM 03/06/2023     3:08 PM 04/02/2022    8:45 AM 03/30/2021   11:13 AM 01/21/2020    9:29 AM 12/16/2018   12:49 PM 09/30/2017    9:31 AM  PHQ 2/9 Scores  PHQ - 2 Score 0 0 0 0 0 1 0    Fall Risk    01/08/2024   10:18 AM 03/06/2023    3:08 PM 04/02/2022    8:45 AM 03/30/2021   11:13 AM 01/21/2020    9:28 AM  Fall Risk   Falls in the past year? 0 0 0 0 0  Number falls in past yr: 0 0 0 0 0  Injury with Fall? 0 0 0 0 0  Risk for fall due to : No Fall Risks No Fall Risks  No Fall Risks   Follow up Falls prevention discussed;Falls evaluation completed Falls evaluation completed  Falls evaluation completed     MEDICARE RISK AT HOME: Medicare Risk at Home Any stairs in or around the home?: Yes If so, are there any without handrails?: No Home free of loose throw rugs in walkways, pet beds, electrical cords, etc?: Yes Adequate lighting in your home to reduce risk of falls?: Yes Life alert?: No Use of a cane, walker or w/c?: No Grab bars in the bathroom?: Yes Shower chair or bench in shower?: Yes Elevated toilet seat or a handicapped toilet?: No  TIMED UP AND GO:  Was the test performed?  No    Cognitive Function:        01/08/2024   10:19 AM  6CIT Screen  What Year? 0 points  What month? 0 points  What time? 0 points  Count back from 20 0 points  Months in reverse 0 points  Repeat phrase 0 points  Total Score 0 points    Immunizations Immunization History  Administered Date(s) Administered   Hep A / Hep B 06/10/2014, 07/12/2014, 12/12/2014   Influenza,inj,Quad PF,6+ Mos 11/30/2014, 01/01/2017, 09/08/2018, 09/01/2019, 08/04/2020  Influenza-Unspecified 09/17/2019, 08/26/2023   PPD Test 06/07/2014, 08/21/2015   Pneumococcal Polysaccharide-23 06/07/2014, 09/08/2018   Tdap 04/02/2022   Zoster Recombinant(Shingrix) 09/16/2018, 11/23/2018    TDAP status: Up to date  Flu Vaccine status: Up to date  Pneumococcal vaccine status: Due, Education has been provided regarding the importance of this  vaccine. Advised may receive this vaccine at local pharmacy or Health Dept. Aware to provide a copy of the vaccination record if obtained from local pharmacy or Health Dept. Verbalized acceptance and understanding.  Covid-19 vaccine status: Declined, Education has been provided regarding the importance of this vaccine but patient still declined. Advised may receive this vaccine at local pharmacy or Health Dept.or vaccine clinic. Aware to provide a copy of the vaccination record if obtained from local pharmacy or Health Dept. Verbalized acceptance and understanding.  Qualifies for Shingles Vaccine? Yes   Zostavax completed Yes   Shingrix Completed?: Yes  Screening Tests Health Maintenance  Topic Date Due   COVID-19 Vaccine (1) Never done   Pneumonia Vaccine 37+ Years old (2 of 2 - PCV) 01/06/2023   DEXA SCAN  Never done   MAMMOGRAM  09/19/2024   Medicare Annual Wellness (AWV)  01/07/2025   Colonoscopy  01/29/2028   DTaP/Tdap/Td (2 - Td or Tdap) 04/02/2032   INFLUENZA VACCINE  Completed   Hepatitis C Screening  Completed   Zoster Vaccines- Shingrix  Completed   HPV VACCINES  Aged Out    Health Maintenance  Health Maintenance Due  Topic Date Due   COVID-19 Vaccine (1) Never done   Pneumonia Vaccine 80+ Years old (2 of 2 - PCV) 01/06/2023   DEXA SCAN  Never done    Colorectal cancer screening: Type of screening: Colonoscopy. Completed 01/29/23. Repeat every 5 years  Mammogram status: Completed 09/19/22. Repeat every year  Bone Density status: Ordered Deferred. Pt provided with contact info and advised to call to schedule appt.    Additional Screening:  Hepatitis C Screening: does qualify; Completed 03/20/09  Vision Screening: Recommended annual ophthalmology exams for early detection of glaucoma and other disorders of the eye. Is the patient up to date with their annual eye exam?  Yes  Who is the provider or what is the name of the office in which the patient attends annual  eye exams? Dr Caren Hazy If pt is not established with a provider, would they like to be referred to a provider to establish care? No .   Dental Screening: Recommended annual dental exams for proper oral hygiene    Community Resource Referral / Chronic Care Management:  CRR required this visit?  No   CCM required this visit?  No     Plan:     I have personally reviewed and noted the following in the patient's chart:   Medical and social history Use of alcohol, tobacco or illicit drugs  Current medications and supplements including opioid prescriptions. Patient is not currently taking opioid prescriptions. Functional ability and status Nutritional status Physical activity Advanced directives List of other physicians Hospitalizations, surgeries, and ER visits in previous 12 months Vitals Screenings to include cognitive, depression, and falls Referrals and appointments  In addition, I have reviewed and discussed with patient certain preventive protocols, quality metrics, and best practice recommendations. A written personalized care plan for preventive services as well as general preventive health recommendations were provided to patient.     Tillie Rung, LPN   [id number]/61/0960   After Visit Summary: (MyChart) Due to this being a  telephonic visit, the after visit summary with patients personalized plan was offered to patient via MyChart   Nurse Notes: None

## 2024-01-08 NOTE — Patient Instructions (Addendum)
Ms. Rotundo , Thank you for taking time to come for your Medicare Wellness Visit. I appreciate your ongoing commitment to your health goals. Please review the following plan we discussed and let me know if I can assist you in the future.   Referrals/Orders/Follow-Ups/Clinician Recommendations:   This is a list of the screening recommended for you and due dates:  Health Maintenance  Topic Date Due   COVID-19 Vaccine (1) Never done   Pneumonia Vaccine (2 of 2 - PCV) 01/06/2023   DEXA scan (bone density measurement)  Never done   Mammogram  09/19/2024   Medicare Annual Wellness Visit  01/07/2025   Colon Cancer Screening  01/29/2028   DTaP/Tdap/Td vaccine (2 - Td or Tdap) 04/02/2032   Flu Shot  Completed   Hepatitis C Screening  Completed   Zoster (Shingles) Vaccine  Completed   HPV Vaccine  Aged Out    Advanced directives: (Copy Requested) Please bring a copy of your health care power of attorney and living will to the office to be added to your chart at your convenience.  Next Medicare Annual Wellness Visit scheduled for next year: Yes

## 2025-01-13 ENCOUNTER — Ambulatory Visit: Payer: Medicare Other

## 2025-01-14 ENCOUNTER — Ambulatory Visit
